# Patient Record
Sex: Female | Born: 1985 | Race: White | Hispanic: No | Marital: Married | Smoking: Never smoker
Health system: Southern US, Community
[De-identification: ages and names within clinical notes are randomized; demographics above are authoritative.]

## PROBLEM LIST (undated history)

## (undated) ENCOUNTER — Inpatient Hospital Stay (HOSPITAL_COMMUNITY): Payer: Self-pay

## (undated) DIAGNOSIS — T7840XA Allergy, unspecified, initial encounter: Secondary | ICD-10-CM

## (undated) DIAGNOSIS — G43909 Migraine, unspecified, not intractable, without status migrainosus: Secondary | ICD-10-CM

## (undated) DIAGNOSIS — F419 Anxiety disorder, unspecified: Secondary | ICD-10-CM

## (undated) HISTORY — DX: Migraine, unspecified, not intractable, without status migrainosus: G43.909

## (undated) HISTORY — PX: WISDOM TOOTH EXTRACTION: SHX21

## (undated) HISTORY — DX: Allergy, unspecified, initial encounter: T78.40XA

## (undated) HISTORY — DX: Anxiety disorder, unspecified: F41.9

---
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
## 2008-04-03 ENCOUNTER — Ambulatory Visit: Payer: Self-pay | Admitting: Internal Medicine

## 2008-04-03 DIAGNOSIS — K5289 Other specified noninfective gastroenteritis and colitis: Secondary | ICD-10-CM | POA: Insufficient documentation

## 2008-04-03 DIAGNOSIS — G43909 Migraine, unspecified, not intractable, without status migrainosus: Secondary | ICD-10-CM | POA: Insufficient documentation

## 2008-11-04 ENCOUNTER — Ambulatory Visit: Payer: Self-pay | Admitting: Family Medicine

## 2008-11-04 DIAGNOSIS — J029 Acute pharyngitis, unspecified: Secondary | ICD-10-CM | POA: Insufficient documentation

## 2008-11-04 DIAGNOSIS — B2790 Infectious mononucleosis, unspecified without complication: Secondary | ICD-10-CM | POA: Insufficient documentation

## 2008-11-05 ENCOUNTER — Encounter: Payer: Self-pay | Admitting: Family Medicine

## 2008-11-10 ENCOUNTER — Encounter: Payer: Self-pay | Admitting: Family Medicine

## 2008-11-16 ENCOUNTER — Telehealth (INDEPENDENT_AMBULATORY_CARE_PROVIDER_SITE_OTHER): Payer: Self-pay | Admitting: *Deleted

## 2008-11-16 LAB — CONVERTED CEMR LAB
AST: 67 units/L — ABNORMAL HIGH (ref 0–37)
Albumin: 3.7 g/dL (ref 3.5–5.2)
Alkaline Phosphatase: 57 units/L (ref 39–117)
BUN: 13 mg/dL (ref 6–23)
Bilirubin, Direct: 0 mg/dL (ref 0.0–0.3)
Eosinophils Relative: 0.5 % (ref 0.0–5.0)
GFR calc non Af Amer: 133.01 mL/min (ref >60)
HCT: 45 % (ref 36.0–46.0)
Hemoglobin: 15.3 g/dL — ABNORMAL HIGH (ref 12.0–15.0)
Lymphocytes Relative: 53.2 % — ABNORMAL HIGH (ref 12.0–46.0)
Lymphs Abs: 4.4 10*3/uL — ABNORMAL HIGH (ref 0.7–4.0)
Monocytes Relative: 11.7 % (ref 3.0–12.0)
Platelets: 187 10*3/uL (ref 150.0–400.0)
Potassium: 4.8 meq/L (ref 3.5–5.1)
Sodium: 144 meq/L (ref 135–145)
Total Bilirubin: 0.5 mg/dL (ref 0.3–1.2)
WBC: 8.1 10*3/uL (ref 4.5–10.5)

## 2008-11-20 ENCOUNTER — Ambulatory Visit: Payer: Self-pay | Admitting: Family Medicine

## 2008-12-02 ENCOUNTER — Telehealth (INDEPENDENT_AMBULATORY_CARE_PROVIDER_SITE_OTHER): Payer: Self-pay | Admitting: *Deleted

## 2008-12-02 LAB — CONVERTED CEMR LAB
ALT: 17 units/L (ref 0–35)
AST: 18 units/L (ref 0–37)
Albumin: 3.6 g/dL (ref 3.5–5.2)
Total Protein: 6.9 g/dL (ref 6.0–8.3)
hCG, Beta Chain, Quant, S: <0.5 milliintl units/mL

## 2009-02-19 ENCOUNTER — Telehealth (INDEPENDENT_AMBULATORY_CARE_PROVIDER_SITE_OTHER): Payer: Self-pay | Admitting: *Deleted

## 2009-02-19 ENCOUNTER — Ambulatory Visit: Payer: Self-pay | Admitting: Family

## 2009-02-19 DIAGNOSIS — R209 Unspecified disturbances of skin sensation: Secondary | ICD-10-CM | POA: Insufficient documentation

## 2009-02-23 ENCOUNTER — Encounter: Payer: Self-pay | Admitting: Family Medicine

## 2009-02-23 ENCOUNTER — Ambulatory Visit: Payer: Self-pay | Admitting: Family

## 2009-02-26 ENCOUNTER — Telehealth (INDEPENDENT_AMBULATORY_CARE_PROVIDER_SITE_OTHER): Payer: Self-pay | Admitting: *Deleted

## 2009-04-08 ENCOUNTER — Ambulatory Visit: Payer: Self-pay | Admitting: Family Medicine

## 2009-04-08 DIAGNOSIS — J019 Acute sinusitis, unspecified: Secondary | ICD-10-CM | POA: Insufficient documentation

## 2009-04-09 ENCOUNTER — Telehealth (INDEPENDENT_AMBULATORY_CARE_PROVIDER_SITE_OTHER): Payer: Self-pay | Admitting: *Deleted

## 2009-04-16 ENCOUNTER — Ambulatory Visit: Payer: Self-pay | Admitting: Family Medicine

## 2009-04-16 ENCOUNTER — Telehealth (INDEPENDENT_AMBULATORY_CARE_PROVIDER_SITE_OTHER): Payer: Self-pay | Admitting: *Deleted

## 2009-04-16 DIAGNOSIS — D518 Other vitamin B12 deficiency anemias: Secondary | ICD-10-CM | POA: Insufficient documentation

## 2009-04-16 DIAGNOSIS — L259 Unspecified contact dermatitis, unspecified cause: Secondary | ICD-10-CM | POA: Insufficient documentation

## 2010-02-08 NOTE — Letter (Signed)
Summary: Out of Work  [facility] [employer]  [address] [place], [place] [address]   Phone: [phone]  Fax: [phone]    April 08, 2009   Employee:  Cynda Acres    To Whom It May Concern:   For Medical reasons, please excuse the above named employee from work for the following dates:  Start:   April 08, 2009  End:   April 09, 2009  If you need additional information, please feel free to contact our office.         Sincerely,    Loreen Freud DO
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
## 2010-02-08 NOTE — Progress Notes (Signed)
Summary: reaction to doxycycline/ldr lowne see  Phone Note Call from Patient Call back at [phone]   Caller: Mom Summary of Call: pt was seen a week ago was given a rx Doxycycline, pt having a reaction; wanted a call back Called back got VM, left msg for pt to call .Kandice Hams  April 16, 2009 11:33 AM spoker with pt mom, pt having itching, red and severly swollen on hands and feet pt d/c med last night  started med on 4/1/11given Benadryl which helped a little now has pain in hand. mom says hands and fngers look like little sausages.  Pt uses [facility] [address] in [place]  Initial call taken by: Kandice Hams,  April 16, 2009 12:31 PM  Follow-up for Phone Call        any trouble breathing cp etc----stop doxy con't benadryl if any sob or cp go to ER--otherwise ov here  Spoke with pt mom ov scheduled .Kandice Hams  April 16, 2009 2:21 PM  Follow-up by: Loreen Freud DO,  April 16, 2009 1:10 PM

## 2010-02-08 NOTE — Assessment & Plan Note (Signed)
Summary: FLU ?/KDC   Vital Signs:  Patient profile:   25 year old female Weight:      102 pounds Temp:     98.4 degrees F oral Pulse rate:   64 / minute Pulse rhythm:   regular BP sitting:   110 / 70  (left arm) Cuff size:   regular  Vitals Entered By: Army Fossa CMA (April 08, 2009 3:12 PM) CC: Pt here for HA, pressure in ears, head congestion, chest congestion. Started Saturday.Has used theraflu. No fever., URI symptoms   History of Present Illness:       This is a 25 year old woman who presents with URI symptoms.  The symptoms began 6 days ago.  Pt here c/o URI since Saturday.  Pt using Theraflu and otc cough med.  The patient complains of nasal congestion, purulent nasal discharge, dry cough, and sick contacts, but denies earache.  The patient denies fever, low-grade fever (<100.5 degrees), fever of 100.5-103 degrees, fever of 103.1-104 degrees, fever to >104 degrees, stiff neck, dyspnea, wheezing, rash, vomiting, diarrhea, use of an antipyretic, and response to antipyretic.  The patient also reports headache.  The patient denies itchy watery eyes, itchy throat, sneezing, seasonal symptoms, response to antihistamine, muscle aches, and severe fatigue.  The patient denies the following risk factors for Strep sinusitis: unilateral facial pain, unilateral nasal discharge, poor response to decongestant, double sickening, tooth pain, Strep exposure, tender adenopathy, and absence of cough.    Current Medications (verified): 1)  Topamax 200 Mg Tabs (Topiramate) .Marland Kitchen.. 1 By Mouth Daily. 2)  Bcp 3)  Biaxin Xl 500 Mg Xr24h-Tab (Clarithromycin) .... 2 By Mouth Once Daily 4)  Prenatal Vitamins 0.8 Mg Tabs (Prenatal Multivit-Min-Fe-Fa) 5)  Cheratussin Ac 100-10 Mg/31ml Syrp (Guaifenesin-Codeine) .Marland Kitchen.. 1-2 Tsp By Mouth At Bedtime As Needed Cough 6)  Veramyst 27.5 Mcg/spray Susp (Fluticasone Furoate) .... 2 Sprays Each Nostril Once Daily  Allergies: 1)  ! Penicillin V Potassium (Penicillin V  Potassium)  Physical Exam  General:  Well-developed,well-nourished,in no acute distress; alert,appropriate and cooperative throughout examination Ears:  External ear exam shows no significant lesions or deformities.  Otoscopic examination reveals clear canals, tympanic membranes are intact bilaterally without bulging, retraction, inflammation or discharge. Hearing is grossly normal bilaterally. Nose:  L frontal sinus tenderness, L maxillary sinus tenderness, R frontal sinus tenderness, and R maxillary sinus tenderness.   Mouth:  Oral mucosa and oropharynx without lesions or exudates.  Teeth in good repair. Neck:  No deformities, masses, or tenderness noted. Lungs:  Normal respiratory effort, chest expands symmetrically. Lungs are clear to auscultation, no crackles or wheezes. Heart:  normal rate and no murmur.   Extremities:  No clubbing, cyanosis, edema, or deformity noted with normal full range of motion of all joints.   Psych:  Cognition and judgment appear intact. Alert and cooperative with normal attention span and concentration. No apparent delusions, illusions, hallucinations   Impression & Recommendations:  Problem # 1:  SINUSITIS - ACUTE-NOS (ICD-461.9)  Her updated medication list for this problem includes:    Biaxin Xl 500 Mg Xr24h-tab (Clarithromycin) .Marland Kitchen... 2 by mouth once daily    Cheratussin Ac 100-10 Mg/55ml Syrp (Guaifenesin-codeine) .Marland Kitchen... 1-2 tsp by mouth at bedtime as needed cough    Veramyst 27.5 Mcg/spray Susp (Fluticasone furoate) .Marland Kitchen... 2 sprays each nostril once daily  Instructed on treatment. Call if symptoms persist or worsen.   Complete Medication List: 1)  Topamax 200 Mg Tabs (Topiramate) .Marland Kitchen.. 1 by mouth daily. 2)  Bcp  3)  Biaxin Xl 500 Mg Xr24h-tab (Clarithromycin) .... 2 by mouth once daily 4)  Prenatal Vitamins 0.8 Mg Tabs (Prenatal multivit-min-fe-fa) 5)  Cheratussin Ac 100-10 Mg/100ml Syrp (Guaifenesin-codeine) .Marland Kitchen.. 1-2 tsp by mouth at bedtime as needed  cough 6)  Veramyst 27.5 Mcg/spray Susp (Fluticasone furoate) .... 2 sprays each nostril once daily Prescriptions: CHERATUSSIN AC 100-10 MG/5ML SYRP (GUAIFENESIN-CODEINE) 1-2 tsp by mouth at bedtime as needed cough  #6 oz x 0   Entered and Authorized by:   Loreen Freud DO   Signed by:   Loreen Freud DO on 04/08/2009   Method used:   Print then Give to Patient   RxID:   [id number] BIAXIN XL 500 MG XR24H-TAB (CLARITHROMYCIN) 2 by mouth once daily  #28 x 0   Entered and Authorized by:   Loreen Freud DO   Signed by:   Loreen Freud DO on 04/08/2009   Method used:   Electronically to        [facility] (retail)       [address]       [place], [place]  [address]       Ph: [phone]       Fax: [phone]   RxID:   [id number]

## 2010-02-08 NOTE — Progress Notes (Signed)
Summary: Cheaper meds   Phone Note Call from Patient Call back at Home Phone [phone]   Caller: Dad Summary of Call: Pts dad called stated that the Biaxin was to expensive for her to get. Is there a cheaper medication she can get? Army Fossa CMA  April 09, 2009 10:15 AM   Follow-up for Phone Call        doxycycline 100 1 by mouth two times a day for 10 days Follow-up by: Loreen Freud DO,  April 09, 2009 10:51 AM  Additional Follow-up for Phone Call Additional follow up Details #1::        Spoke with Morrie Sheldon and she is aware of med change and would like rx sent to [facility] in [place] Additional Follow-up by: Shonna Chock,  April 09, 2009 11:22 AM    New/Updated Medications: DOXYCYCLINE HYCLATE 100 MG CAPS (DOXYCYCLINE HYCLATE) 1 by mouth two times a day Prescriptions: DOXYCYCLINE HYCLATE 100 MG CAPS (DOXYCYCLINE HYCLATE) 1 by mouth two times a day  #20 x 0   Entered by:   Shonna Chock   Authorized by:   Loreen Freud DO   Signed by:   Shonna Chock on 04/09/2009   Method used:   Electronically to        [facility] (retail)       [address]       [place], [place]  [address]       Ph: [phone]       Fax: [phone]   RxID:   [id number]

## 2010-02-08 NOTE — Assessment & Plan Note (Signed)
Summary: reaction to abx/alr   Vital Signs:  Patient profile:   25 year old female Weight:      101.13 pounds Pulse rate:   72 / minute Pulse rhythm:   regular BP sitting:   122 / 78  (left arm) Cuff size:   regular  Vitals Entered By: Army Fossa CMA (April 16, 2009 3:56 PM) CC: Pt has redness, swelling in her hands and feet. Numbness in arms since monday.   History of Present Illness: Pt here c/o reddness and swelling in fingers  a few days after starting doxy.   It is painful and itchy.   Current Medications (verified): 1)  Topamax 200 Mg Tabs (Topiramate) .Marland Kitchen.. 1 By Mouth Daily. 2)  Bcp 3)  Doxycycline Hyclate 100 Mg Caps (Doxycycline Hyclate) .Marland Kitchen.. 1 By Mouth Two Times A Day 4)  Prenatal Vitamins 0.8 Mg Tabs (Prenatal Multivit-Min-Fe-Fa) 5)  Cheratussin Ac 100-10 Mg/62ml Syrp (Guaifenesin-Codeine) .Marland Kitchen.. 1-2 Tsp By Mouth At Bedtime As Needed Cough 6)  Veramyst 27.5 Mcg/spray Susp (Fluticasone Furoate) .... 2 Sprays Each Nostril Once Daily 7)  Prednisone 10 Mg Tabs (Prednisone) .... 3 By Mouth Once Daily For 3 Days Then 2 By Mouth Once Daily For 3 Days Then 1 By Mouth Once Daily For 3 Days  Allergies: 1)  ! Penicillin V Potassium (Penicillin V Potassium)  Past History:  Past medical, surgical, family and social histories (including risk factors) reviewed for relevance to current acute and chronic problems.  Past Medical History: Reviewed history from 04/03/2008 and no changes required. Headache, migraines  Past Surgical History: Reviewed history from 04/03/2008 and no changes required. G 0 P0  Denies surgical history  Family History: Reviewed history and no changes required.  Social History: Reviewed history and no changes required.  Review of Systems      See HPI  Physical Exam  General:  Well-developed,well-nourished,in no acute distress; alert,appropriate and cooperative throughout examination Skin:  + errythema both hands index fingers and thumbs toes  swollen but no errythema Psych:  Oriented X3 and normally interactive.     Impression & Recommendations:  Problem # 1:  CONTACT DERMATITIS&OTHER ECZEMA DUE UNSPEC CAUSE (ICD-692.9)  not convinced it is doxy  con't zyrtec Her updated medication list for this problem includes:    Prednisone 10 Mg Tabs (Prednisone) .Marland KitchenMarland KitchenMarland KitchenMarland Kitchen 3 by mouth once daily for 3 days then 2 by mouth once daily for 3 days then 1 by mouth once daily for 3 days  Orders: Admin of Therapeutic Inj  intramuscular or subcutaneous (72536) Depo- Medrol 80mg  (J1040)  Complete Medication List: 1)  Topamax 200 Mg Tabs (Topiramate) .Marland Kitchen.. 1 by mouth daily. 2)  Bcp  3)  Doxycycline Hyclate 100 Mg Caps (Doxycycline hyclate) .Marland Kitchen.. 1 by mouth two times a day 4)  Prenatal Vitamins 0.8 Mg Tabs (Prenatal multivit-min-fe-fa) 5)  Cheratussin Ac 100-10 Mg/20ml Syrp (Guaifenesin-codeine) .Marland Kitchen.. 1-2 tsp by mouth at bedtime as needed cough 6)  Veramyst 27.5 Mcg/spray Susp (Fluticasone furoate) .... 2 sprays each nostril once daily 7)  Prednisone 10 Mg Tabs (Prednisone) .... 3 by mouth once daily for 3 days then 2 by mouth once daily for 3 days then 1 by mouth once daily for 3 days  Other Orders: Vit B12 1000 mcg (J3420) Prescriptions: PREDNISONE 10 MG TABS (PREDNISONE) 3 by mouth once daily for 3 days then 2 by mouth once daily for 3 days then 1 by mouth once daily for 3 days  #18 x 0  Entered and Authorized by:   Loreen Freud DO   Signed by:   Loreen Freud DO on 04/16/2009   Method used:   Electronically to        [facility] (retail)       [address]       [place], [place]  [address]       Ph: [phone]       Fax: [phone]   RxID:   [id number]    Medication Administration  Injection # 1:    Medication: Depo- Medrol 80mg     Diagnosis: CONTACT DERMATITIS&OTHER ECZEMA DUE UNSPEC CAUSE (ICD-692.9)    Route: IM    Site: RUOQ gluteus    Exp Date: 11/2009    Lot #: [id number]    Mfr:  novaplus    Patient tolerated injection without complications    Given by: Army Fossa CMA (April 16, 2009 4:22 PM)  Injection # 2:    Medication: Vit B12 1000 mcg    Diagnosis: OTHER VITAMIN B12 DEFICIENCY ANEMIA (ICD-281.1)    Route: IM    Site: R deltoid    Exp Date: 02/2011    Lot #: [id number]    Mfr: [employer] Regent    Patient tolerated injection without complications    Given by: Army Fossa CMA (April 16, 2009 4:23 PM)  Orders Added: 1)  Admin of Therapeutic Inj  intramuscular or subcutaneous [96372] 2)  Vit B12 1000 mcg [J3420] 3)  Depo- Medrol 80mg  [J1040] 4)  Est. Patient Level III [43329]

## 2010-02-08 NOTE — Progress Notes (Signed)
Summary: f/u labs  Phone Note Outgoing Call   Summary of Call: Please arrange follow up for following tests RPR, B12, Folate (782) Thanks Initial call taken by: Lemont Fillers FNP,  February 19, 2009 5:56 PM  Follow-up for Phone Call        spoke w/ patient father appt scheduled for labs........Marland KitchenDoristine Devoid  February 22, 2009 2:06 PM

## 2010-02-08 NOTE — Progress Notes (Signed)
Summary: Lab Results   Phone Note Outgoing Call   Call placed by: Army Fossa CMA,  February 26, 2009 2:46 PM Summary of Call: Regarding lab results, LMTCB:  B12 injections weekly for 1 month then monthly---recheck 1 month----b12 is low Signed by Loreen Freud DO on 02/26/2009 at 12:43 PM  Follow-up for Phone Call        Pt is aware. Army Fossa CMA  March 01, 2009 9:10 AM

## 2012-05-13 ENCOUNTER — Ambulatory Visit (INDEPENDENT_AMBULATORY_CARE_PROVIDER_SITE_OTHER): Payer: No Typology Code available for payment source | Admitting: Family Medicine

## 2012-05-13 ENCOUNTER — Encounter: Payer: Self-pay | Admitting: Family Medicine

## 2012-05-13 VITALS — BP 100/70 | HR 65 | Temp 98.2°F | Ht 60.25 in | Wt 104.4 lb

## 2012-05-13 DIAGNOSIS — Z91030 Bee allergy status: Secondary | ICD-10-CM

## 2012-05-13 DIAGNOSIS — R51 Headache: Secondary | ICD-10-CM

## 2012-05-13 DIAGNOSIS — Z91038 Other insect allergy status: Secondary | ICD-10-CM

## 2012-05-13 MED ORDER — EPINEPHRINE 0.3 MG/0.3ML IJ SOAJ: 0.3000 mg | Freq: Once | INTRAMUSCULAR | Status: DC | PRN

## 2012-05-13 NOTE — Patient Instructions (Addendum)
Schedule your complete physical at your convenience Use the Epipen only as needed Try and wean off the topamax Call with any questions or concerns Welcome Back!!!

## 2012-05-13 NOTE — Progress Notes (Signed)
  Subjective:    Patient ID: Lisa Berg, female    DOB: 1985/11/09, 27 y.o.   MRN: [id number]  HPI Allergic rxn- pt has hx of allergy to bee stings.  Has not had to use epipen and current pen was filled in Aug 2011.  Does not struggle w/ seasonal allergies.  Has never had a food allergy.  Reports at time of last sting, her throat swelled and she had difficulty breathing.  Migraines- currently on Topamax, seeing Neuro in [place].  topamax does fairly well at controlling migraines.  Will have ~5 migraines/month.  HAs improve w/ Advil liquid gels.  Pt is questioning whether she needs to wean off topamax b/c she recently stopped her birth control and is not doing anything to prevent pregnancy.   Review of Systems For ROS see HPI     Objective:   Physical Exam  Vitals reviewed. Constitutional: She is oriented to person, place, and time. She appears well-developed and well-nourished. No distress.  HENT:  Head: Normocephalic and atraumatic.  Eyes: Conjunctivae and EOM are normal. Pupils are equal, round, and reactive to light.  Neck: Normal range of motion. Neck supple. No thyromegaly present.  Cardiovascular: Normal rate, regular rhythm, normal heart sounds and intact distal pulses.   No murmur heard. Pulmonary/Chest: Effort normal and breath sounds normal. No respiratory distress.  Abdominal: Soft. She exhibits no distension. There is no tenderness.  Musculoskeletal: She exhibits no edema.  Lymphadenopathy:    She has no cervical adenopathy.  Neurological: She is alert and oriented to person, place, and time.  Skin: Skin is warm and dry.  Psychiatric: She has a normal mood and affect. Her behavior is normal.          Assessment & Plan:

## 2012-05-16 DIAGNOSIS — Z91030 Bee allergy status: Secondary | ICD-10-CM | POA: Insufficient documentation

## 2012-05-16 NOTE — Assessment & Plan Note (Signed)
New to provider, ongoing for pt.  Following w/ neuro in [place].  Discussed w/ pt and husband that Topamax is category D in pregnancy and can cause cleft lip/palate.  Strongly encouraged that pt use condoms until able to wean off Topamax w/ assistance of neuro.  Pt expressed understanding and is in agreement w/ plan.

## 2012-05-16 NOTE — Assessment & Plan Note (Signed)
New.  Based on report, it sounds as if pt has true anaphylaxis.  New script for Epi-pen given along w/ instructions on use.

## 2012-06-14 ENCOUNTER — Encounter: Payer: Self-pay | Admitting: Family Medicine

## 2012-06-14 ENCOUNTER — Ambulatory Visit (INDEPENDENT_AMBULATORY_CARE_PROVIDER_SITE_OTHER): Payer: No Typology Code available for payment source | Admitting: Family Medicine

## 2012-06-14 VITALS — BP 100/58 | HR 88 | Temp 98.9°F | Ht 60.25 in | Wt 111.6 lb

## 2012-06-14 DIAGNOSIS — O98519 Other viral diseases complicating pregnancy, unspecified trimester: Secondary | ICD-10-CM

## 2012-06-14 DIAGNOSIS — O98511 Other viral diseases complicating pregnancy, first trimester: Secondary | ICD-10-CM

## 2012-06-14 DIAGNOSIS — B349 Viral infection, unspecified: Secondary | ICD-10-CM | POA: Insufficient documentation

## 2012-06-14 NOTE — Patient Instructions (Addendum)
We'll notify you of your lab results Wash hands OFTEN to try avoid illness Drink plenty of fluids REST! CONGRATS!!!!

## 2012-06-14 NOTE — Progress Notes (Signed)
  Subjective:    Patient ID: Lisa Berg, female    DOB: August 04, 1985, 27 y.o.   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  Multiple children sent home w/ HFM.  Pt currently feeling well- no upper respiratory sxs or rash.  No fevers.  Has appt upcoming w/ GYN.   Review of Systems For ROS see HPI     Objective:   Physical Exam  Vitals reviewed. Constitutional: She is oriented to person, place, and time. She appears well-developed and well-nourished. No distress.  HENT:  Head: Normocephalic and atraumatic.  Nose: Nose normal.  Mouth/Throat: Oropharynx is clear and moist. No oropharyngeal exudate.  No oral ulcers, no erythema of throat  Neurological: She is alert and oriented to person, place, and time.  Skin: Skin is warm and dry. No rash noted.  Psychiatric: She has a normal mood and affect. Her behavior is normal. Thought content normal.          Assessment & Plan:

## 2012-06-16 ENCOUNTER — Inpatient Hospital Stay (HOSPITAL_COMMUNITY)
Admission: AD | Admit: 2012-06-16 | Discharge: 2012-06-17 | Disposition: A | Discharge status: Home / Self Care | Payer: No Typology Code available for payment source | Source: Ambulatory Visit | Attending: Obstetrics & Gynecology | Admitting: Obstetrics & Gynecology

## 2012-06-16 DIAGNOSIS — O209 Hemorrhage in early pregnancy, unspecified: Secondary | ICD-10-CM | POA: Insufficient documentation

## 2012-06-16 NOTE — Assessment & Plan Note (Signed)
New.  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  Pt expressed understanding and is in agreement w/ plan.

## 2012-06-16 NOTE — MAU Note (Signed)
Pt reports she is [redacted] weeks pregnant and when she went to the restroom tonight she saw some blood on the tissue. Denies pain

## 2012-06-17 ENCOUNTER — Inpatient Hospital Stay (HOSPITAL_COMMUNITY): Payer: No Typology Code available for payment source

## 2012-06-17 ENCOUNTER — Encounter (HOSPITAL_COMMUNITY): Payer: Self-pay | Admitting: *Deleted

## 2012-06-17 DIAGNOSIS — O209 Hemorrhage in early pregnancy, unspecified: Secondary | ICD-10-CM

## 2012-06-17 LAB — CBC
Platelets: 270 10*3/uL (ref 150–400)
RBC: 4.2 MIL/uL (ref 3.87–5.11)
WBC: 10.3 10*3/uL (ref 4.0–10.5)

## 2012-06-17 LAB — ABO/RH: ABO/RH(D): A POS

## 2012-06-17 NOTE — MAU Provider Note (Signed)
History     CSN: [id number]  Arrival date and time: 06/16/12 2338   None     Chief Complaint  Patient presents with  . Vaginal Bleeding  . Possible Pregnancy   HPI  SOFFIA DOSHIER is a 27 y.o. G1P0 at [redacted]w[redacted]d who presents today with vaginal bleeding. She states that yesterday she had some spotting and today she had heavier bleeding that started around 2130-2200. She denies any pain. She is planning on going to [facility] and has an appointment there on Thursday.   Past Medical History  Diagnosis Date  . Migraine     History reviewed. No pertinent past surgical history.  Family History  Problem Relation Age of Onset  . Cancer Mother   . Depression Mother   . Hyperlipidemia Mother   . Hyperlipidemia Father   . Heart disease Father   . Hypertension Father   . Hyperlipidemia Sister   . Stroke Maternal Aunt   . Miscarriages / Stillbirths Maternal Grandmother   . Diabetes Maternal Grandfather   . Heart disease Maternal Grandfather   . Asthma Neg Hx   . Birth defects Neg Hx   . COPD Neg Hx   . Drug abuse Neg Hx   . Hearing loss Neg Hx   . Kidney disease Neg Hx   . Learning disabilities Neg Hx     History  Substance Use Topics  . Smoking status: Never Smoker   . Smokeless tobacco: Not on file  . Alcohol Use: No    Allergies:  Allergies  Allergen Reactions  . Penicillins     REACTION: rash    Prescriptions prior to admission  Medication Sig Dispense Refill  . Cyanocobalamin (VITAMIN B 12 PO) Take 1 tablet by mouth daily.      Marland Kitchen PRENATAL VITAMINS PO Take 1 tablet by mouth daily.      Marland Kitchen topiramate (TOPAMAX) 200 MG tablet Take 200 mg by mouth daily.      Marland Kitchen EPINEPHrine (EPI-PEN) 0.3 mg/0.3 mL DEVI Inject 0.3 mLs (0.3 mg total) into the muscle once as needed (Use as directed for allergy reaction.).  1 Device  3    Review of Systems  Constitutional: Negative for fever and chills.  Respiratory: Negative for shortness of breath.   Cardiovascular:  Negative for chest pain.  Gastrointestinal: Negative for nausea, vomiting, abdominal pain, diarrhea and constipation.  Genitourinary: Negative for dysuria, urgency and frequency.  Musculoskeletal: Negative for myalgias.  Neurological: Negative for dizziness.   Physical Exam   Blood pressure 122/66, pulse 96, temperature 98.7 F (37.1 C), temperature source Oral, resp. rate 20, height 5' (1.524 m), weight 50.803 kg (112 lb), last menstrual period 04/29/2012, SpO2 100.00%.  Physical Exam  Nursing note and vitals reviewed. Constitutional: She is oriented to person, place, and time. She appears well-developed and well-nourished. No distress.  Cardiovascular: Normal rate.   Respiratory: Effort normal.  GI: Soft.  Neurological: She is alert and oriented to person, place, and time.  Skin: Skin is warm and dry.  Psychiatric: She has a normal mood and affect.    MAU Course  Procedures  Results for orders placed during the hospital encounter of 06/16/12 (from the past 24 hour(s))  CBC     Status: None   Collection Time    06/17/12 12:20 AM      Result Value Range   WBC 10.3  4.0 - 10.5 K/uL   RBC 4.20  3.87 - 5.11 MIL/uL   Hemoglobin  12.5  12.0 - 15.0 g/dL   HCT 16.1  09.6 - 04.5 %   MCV 89.3  78.0 - 100.0 fL   MCH 29.8  26.0 - 34.0 pg   MCHC 33.3  30.0 - 36.0 g/dL   RDW 40.9  81.1 - 91.4 %   Platelets 270  150 - 400 K/uL  ABO/RH     Status: None   Collection Time    06/17/12 12:20 AM      Result Value Range   ABO/RH(D) A POS     *RADIOLOGY REPORT*  Clinical Data: Bleeding with early pregnancy. Estimated gestational  age by LMP is 7 weeks 1 day. Quantitative beta HCG is pending.  Latex allergy.  OBSTETRIC <14 WK ULTRASOUND  Technique: Transabdominal ultrasound was performed for evaluation  of the gestation as well as the maternal uterus and adnexal  regions.  Comparison: None.  Intrauterine gestational sac: Visualized/normal in shape.  Yolk sac: Yolk sac is identified.   Embryo: Fetal pole is identified.  Cardiac Activity: Fetal cardiac activity is identified.  Heart Rate: 114 bpm  CRL: 9.4 mm 7 w 0 d [place] EDC: 02/03/2013  Maternal uterus/Adnexae:  No subchorionic hemorrhage identified. No myometrial masses. No  free pelvic fluid collections. Both ovaries are visualized and  appear symmetrical. No abnormal adnexal masses identified.  IMPRESSION:  Single intrauterine pregnancy. Estimated gestational age by crown-  rump length is 7-week 0 days. This is consistent with reported  maternal dates.  Original Report Authenticated By: Burman Nieves, M.D.  Assessment and Plan   1. First trimester bleeding    Danger signs reviewed [facility] as scheduled Return to MAU as needed  Tawnya Crook 06/17/2012, 1:10 AM

## 2012-06-17 NOTE — MAU Note (Signed)
Pt reports that she was noticed light bleeding at 2200. Pt had some bleeding "like you are just about to start your period" yesterday then had brown discharge this am. Pt denies pain.

## 2012-06-17 NOTE — MAU Provider Note (Signed)
Attestation of Attending Supervision of Advanced Practitioner (CNM/NP): Evaluation and management procedures were performed by the Advanced Practitioner under my supervision and collaboration.  I have reviewed the Advanced Practitioner's note and chart, and I agree with the management and plan.  HARRAWAY-SMITH, Dasha Kawabata 1:36 AM

## 2012-06-19 LAB — PARVOVIRUS B19 ANTIBODY, IGG AND IGM
Parovirus B19 IgG Abs: 0.2 index (ref <0.9)
Parovirus B19 IgM Abs: 0.1 index (ref <0.9)

## 2012-06-26 LAB — OB RESULTS CONSOLE HEPATITIS B SURFACE ANTIGEN: Hepatitis B Surface Ag: NEGATIVE

## 2012-06-26 LAB — OB RESULTS CONSOLE RPR: RPR: NONREACTIVE

## 2012-06-26 LAB — OB RESULTS CONSOLE ABO/RH: RH Type: POSITIVE

## 2012-07-22 ENCOUNTER — Encounter: Payer: No Typology Code available for payment source | Attending: Obstetrics and Gynecology | Admitting: Dietician

## 2012-07-22 ENCOUNTER — Encounter: Payer: Self-pay | Admitting: Dietician

## 2012-07-22 VITALS — Ht 60.0 in | Wt 112.1 lb

## 2012-07-22 DIAGNOSIS — E638 Other specified nutritional deficiencies: Secondary | ICD-10-CM | POA: Insufficient documentation

## 2012-07-22 DIAGNOSIS — Z713 Dietary counseling and surveillance: Secondary | ICD-10-CM | POA: Insufficient documentation

## 2012-07-22 DIAGNOSIS — O99891 Other specified diseases and conditions complicating pregnancy: Secondary | ICD-10-CM | POA: Insufficient documentation

## 2012-07-22 DIAGNOSIS — R638 Other symptoms and signs concerning food and fluid intake: Secondary | ICD-10-CM

## 2012-07-22 NOTE — Patient Instructions (Addendum)
Include protein sources with meals and snacks such as eggs (scrambled, or omelet), cheese sticks, nuts, peanut butter, sunflower seeds and milk.  Add more vegetables such as carrots, salads, green beans.  Take B12 and prenatal each day.  Talk with doctor to see if one can of caffenated soda per day is okay. Try tapering down or switching to decaf if possible.   Add walks for physical activity when possible.

## 2012-07-22 NOTE — Progress Notes (Signed)
  Medical Nutrition Therapy:  Appt start time: 1500 end time:  1600.   Assessment:  Primary concerns today: Patient is concerned that she doesn't eat meat and is pregnant. Patient says she is picky and does not like most beans, but does drink plenty (3-4 cups) of whole milk throughout the day. 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 In addition to milk, she sometimes eats eggs and cheese for protein. She likes most fruit and vegetables such as baked beans, green beans, peas, broccoli, carrots, corn, yams, and salads. Gracynn also usually has one caffeinated soda each day which she hasn't discuss with her doctor.   Catheryne is taking her prenatal vitamin and B12 each day and was advised to make sure to include protein such as milk, eggs, peanut butter, nuts, or seeds with meals and snacks. Discussed adding more vegetables to meals if possible and talking to her doctor to see if it's ok to continue drinking soda with caffeine.      MEDICATIONS: see list   DIETARY INTAKE:  Avoided foods include meat, seafood.   24-hr recall:  B ( AM): cereal (cheerios, fruit loops) with whole milk, muffins, pop tarts, waffles, sometimes an omelet. Sometimes drinks water, or soda, juice rarely. Snk ( AM): none L ( PM): pizza, pasta, leftovers. Drinks tea, lemonade, soda (if not with breakfast), water. Snk ( PM): Special K bar, chips, crackers, carrots, fruit D ( PM): macaroni and cheese, pizza, vegetable or two, sometimes drinks milk with dinner Beverages: 3-4 cups total per day  Snk ( PM): none usually, sometimes ice cream  Usual physical activity: walk once in a while  Estimated energy needs: 2200 calories 248 g carbohydrates 165 g protein 61 g fat  Progress Towards Goal(s):  In progress.   Nutritional Diagnosis:  NB-1.1 Food and nutrition-related knowledge deficit As related to vegetarian diet .  As evidenced by inadequate protein  intake during pregnancy. .    Intervention:  Nutrition Counseling provided:  Include protein sources with meals and snacks such as eggs (scrambled or omelet), cheese sticks, nuts, peanut butter, sunflower seeds and milk.  Add more vegetables such as carrots, salads, green beans.  Take B12 and prenatal each day.  Talk with doctor to see if one can of caffenated soda per day is okay. Try tapering down or switching to decaf if possible.   Add walks for physical activity when possible.    Monitoring/Evaluation:  Dietary intake, exercise, and body weight in 2 month(s).

## 2012-08-01 ENCOUNTER — Ambulatory Visit: Payer: Self-pay | Admitting: *Deleted

## 2012-09-23 ENCOUNTER — Ambulatory Visit: Payer: No Typology Code available for payment source | Admitting: Dietician

## 2012-10-24 ENCOUNTER — Inpatient Hospital Stay (HOSPITAL_COMMUNITY)
Admission: AD | Admit: 2012-10-24 | Discharge: 2012-10-24 | Disposition: A | Discharge status: Home / Self Care | Payer: No Typology Code available for payment source | Source: Ambulatory Visit | Attending: Obstetrics and Gynecology | Admitting: Obstetrics and Gynecology

## 2012-10-24 ENCOUNTER — Encounter (HOSPITAL_COMMUNITY): Payer: Self-pay | Admitting: *Deleted

## 2012-10-24 DIAGNOSIS — W19XXXA Unspecified fall, initial encounter: Secondary | ICD-10-CM

## 2012-10-24 DIAGNOSIS — S3991XA Unspecified injury of abdomen, initial encounter: Secondary | ICD-10-CM

## 2012-10-24 DIAGNOSIS — O99891 Other specified diseases and conditions complicating pregnancy: Secondary | ICD-10-CM | POA: Insufficient documentation

## 2012-10-24 DIAGNOSIS — R1084 Generalized abdominal pain: Secondary | ICD-10-CM

## 2012-10-24 DIAGNOSIS — W010XXA Fall on same level from slipping, tripping and stumbling without subsequent striking against object, initial encounter: Secondary | ICD-10-CM

## 2012-10-24 DIAGNOSIS — W2203XA Walked into furniture, initial encounter: Secondary | ICD-10-CM | POA: Insufficient documentation

## 2012-10-24 DIAGNOSIS — Y92009 Unspecified place in unspecified non-institutional (private) residence as the place of occurrence of the external cause: Secondary | ICD-10-CM | POA: Insufficient documentation

## 2012-10-24 LAB — URINALYSIS, ROUTINE W REFLEX MICROSCOPIC
Leukocytes, UA: NEGATIVE
Protein, ur: NEGATIVE mg/dL
Urobilinogen, UA: 0.2 mg/dL (ref 0.0–1.0)

## 2012-10-24 NOTE — MAU Provider Note (Signed)
Chief Complaint:  Fall   First Provider Initiated Contact with Patient 10/24/12 1519      HPI: Lisa Berg is a 27 y.o. G1P0 at [redacted]w[redacted]d who presents to maternity admissions reporting Abdominal trauma that occurred approximately 2 hours prior to arrival. She states she was sitting on the floor holding the child and stood up losing her balance and struck her right upper abdomen on the table. The area of impact is sore. Denies other injury Denies contractions, leakage of fluid or vaginal bleeding. Good fetal movement.   Pregnancy Course: Uncomplicated. A positive blood type.  Past Medical History: Past Medical History  Diagnosis Date  . Migraine   . Allergy     Past obstetric history: OB History  Gravida Para Term Preterm AB SAB TAB Ectopic Multiple Living  1             # Outcome Date GA Lbr Len/2nd Weight Sex Delivery Anes PTL Lv  1 CUR               Past Surgical History: Past Surgical History  Procedure Laterality Date  . Wisdom tooth extraction       Family History: Family History  Problem Relation Age of Onset  . Cancer Mother   . Depression Mother   . Hyperlipidemia Mother   . Hyperlipidemia Father   . Heart disease Father   . Hypertension Father   . Hyperlipidemia Sister   . Stroke Maternal Aunt   . Miscarriages / Stillbirths Maternal Grandmother   . Diabetes Maternal Grandfather   . Heart disease Maternal Grandfather   . Asthma Neg Hx   . Birth defects Neg Hx   . COPD Neg Hx   . Drug abuse Neg Hx   . Hearing loss Neg Hx   . Kidney disease Neg Hx   . Learning disabilities Neg Hx     Social History: History  Substance Use Topics  . Smoking status: Never Smoker   . Smokeless tobacco: Not on file  . Alcohol Use: No    Allergies:  Allergies  Allergen Reactions  . Bee Venom Swelling  . Latex   . Penicillins Rash    Meds:  Prescriptions prior to admission  Medication Sig Dispense Refill  . acetaminophen (TYLENOL) 500 MG tablet Take  1,000 mg by mouth every 6 (six) hours as needed for pain.      . Cyanocobalamin (VITAMIN B 12 PO) Take 1 tablet by mouth daily.      Tery Sanfilippo Calcium (STOOL SOFTENER PO) Take 1 tablet by mouth daily.      . Prenatal Vit-Fe Fumarate-FA (PRENATAL MULTIVITAMIN) TABS tablet Take 1 tablet by mouth daily at 12 noon.      Marland Kitchen EPINEPHrine (EPI-PEN) 0.3 mg/0.3 mL DEVI Inject 0.3 mLs (0.3 mg total) into the muscle once as needed (Use as directed for allergy reaction.).  1 Device  3    ROS: Pertinent findings in history of present illness.  Physical Exam  Blood pressure 114/75, pulse 94, temperature 97.9 F (36.6 C), temperature source Oral, resp. rate 18, last menstrual period 04/29/2012. GENERAL: Well-developed, well-nourished female in no acute distress.  HEENT: normocephalic HEART: normal rate RESP: normal effort ABDOMEN: Soft, gravid appropriate for gestational age. Mildly tender area of erythema, slight abrasion and edema RUQ subcostal margin. Fundus NT. EXTREMITIES: Nontender, no edema NEURO: alert and oriented SPECULUM EXAM: NEFG, physiologic discharge, no blood, cervix clean    FHT:  Baseline 140 , moderate variability, accelerations  present, no decelerations Contractions: none   Labs: Results for orders placed during the hospital encounter of 10/24/12 (from the past 24 hour(s))  URINALYSIS, ROUTINE W REFLEX MICROSCOPIC     Status: Abnormal   Collection Time    10/24/12  2:30 PM      Result Value Range   Color, Urine STRAW (*) YELLOW   APPearance CLEAR  CLEAR   Specific Gravity, Urine <1.005 (*) 1.005 - 1.030   pH 6.5  5.0 - 8.0   Glucose, UA NEGATIVE  NEGATIVE mg/dL   Hgb urine dipstick NEGATIVE  NEGATIVE   Bilirubin Urine NEGATIVE  NEGATIVE   Ketones, ur NEGATIVE  NEGATIVE mg/dL   Protein, ur NEGATIVE  NEGATIVE mg/dL   Urobilinogen, UA 0.2  0.0 - 1.0 mg/dL   Nitrite NEGATIVE  NEGATIVE   Leukocytes, UA NEGATIVE  NEGATIVE    Imaging:  No results found. MAU Course: Ice to  affected area 1545: message left on AM Dr. Renaldo Fiddler 1600: Care assumed by Venia Carbon NP [phone]: Discussed patients presentation with Dr. Renaldo Fiddler; pt experiencing no vaginal bleeding, no contractions on the monitor, baby is reactive.  Fetal Tracing: Baseline: 145 bpm  Variability: Moderate  Accelerations: 10x10 Decelerations: None Toco: No contractions   Assessment: G1 at [redacted]w[redacted]d 1. Pregnancy with abdominal wall injury, antepartum    Plan: Discharge home Return to MAU with any pain or vaginal bleeding Ok to take tylenol as needed, as directed on the bottle Ok to ice your abdomen as needed  Iona Hansen Lynnet Hefley, NP 10/24/2012 4:37 PM    Deirdre Colin Mulders, CNM 10/24/2012 3:36 PM

## 2012-10-24 NOTE — MAU Note (Signed)
Pt fell @ 1400, getting up from floor holding a child, fell & hit RUQ on table.  Pt denies bleeding or LOF, denies pain.

## 2012-11-19 ENCOUNTER — Inpatient Hospital Stay (HOSPITAL_COMMUNITY)
Admission: AD | Admit: 2012-11-19 | Discharge: 2012-11-19 | Disposition: A | Discharge status: Home / Self Care | Payer: No Typology Code available for payment source | Source: Ambulatory Visit | Attending: Obstetrics and Gynecology | Admitting: Obstetrics and Gynecology

## 2012-11-19 ENCOUNTER — Encounter (HOSPITAL_COMMUNITY): Payer: Self-pay | Admitting: *Deleted

## 2012-11-19 DIAGNOSIS — O26879 Cervical shortening, unspecified trimester: Secondary | ICD-10-CM | POA: Insufficient documentation

## 2012-11-19 DIAGNOSIS — R109 Unspecified abdominal pain: Secondary | ICD-10-CM | POA: Insufficient documentation

## 2012-11-19 MED ORDER — BETAMETHASONE SOD PHOS & ACET 6 (3-3) MG/ML IJ SUSP
12.0000 mg | Freq: Once | INTRAMUSCULAR | Status: AC
  Administered 2012-11-19: 12 mg via INTRAMUSCULAR
  Filled 2012-11-19: qty 2

## 2012-11-19 NOTE — MAU Note (Signed)
Pt states she has had some mild lower abd cramping intermittently today.

## 2012-11-19 NOTE — MAU Note (Signed)
Pt sent from MD office for BMZ & moitoring, short cervix dx'd on U/S today.

## 2012-11-20 ENCOUNTER — Inpatient Hospital Stay (HOSPITAL_COMMUNITY)
Admission: AD | Admit: 2012-11-20 | Discharge: 2012-11-20 | Disposition: A | Discharge status: Home / Self Care | Payer: No Typology Code available for payment source | Source: Ambulatory Visit | Attending: Obstetrics and Gynecology | Admitting: Obstetrics and Gynecology

## 2012-11-20 ENCOUNTER — Inpatient Hospital Stay (HOSPITAL_COMMUNITY): Payer: No Typology Code available for payment source

## 2012-11-20 DIAGNOSIS — O4700 False labor before 37 completed weeks of gestation, unspecified trimester: Secondary | ICD-10-CM | POA: Insufficient documentation

## 2012-11-20 MED ORDER — BETAMETHASONE SOD PHOS & ACET 6 (3-3) MG/ML IJ SUSP
12.0000 mg | Freq: Once | INTRAMUSCULAR | Status: AC
  Administered 2012-11-20: 12 mg via INTRAMUSCULAR
  Filled 2012-11-20: qty 2

## 2012-11-20 NOTE — MAU Provider Note (Signed)
S: 27 y.o. G1P0 @[redacted]w[redacted]d  presents to MAU sent from office for BMZ injection, monitoring, and evaluation for PTL.  She reports good fetal movement, denies LOF, vaginal bleeding, vaginal itching/burning, urinary symptoms, h/a, dizziness, n/v, or fever/chills.    O: BP 113/68  Pulse 96  Temp(Src) 98.7 F (37.1 C) (Oral)  Resp 16  LMP 04/29/2012  Physical Examination: General appearance - alert, well appearing, and in no distress Respirations unlabored Heartrate regular  FHR baseline 140 with positive accels, no decels, moderate variability No contractions on toco or to palpation  A: Threatened preterm labor  P: Medical screen performed RN to call Dr Corky Sing Certified Nurse-Midwife

## 2012-11-20 NOTE — MAU Note (Signed)
Patient to MAU for 2nd dose of BMZ. Patient called Dr. Henderson Cloud and very concerned about the cervical exam on ultrasound yesterday. Orders for monitoring and a repeat ultrasound received. Patient states she has some Braxton Hicks last night, none today. Denies bleeding or leaking and reports fetal movement but not as much as usual.

## 2012-12-02 ENCOUNTER — Encounter (HOSPITAL_COMMUNITY): Payer: Self-pay

## 2012-12-02 ENCOUNTER — Inpatient Hospital Stay (HOSPITAL_COMMUNITY): Payer: No Typology Code available for payment source

## 2012-12-02 ENCOUNTER — Inpatient Hospital Stay (HOSPITAL_COMMUNITY)
Admission: AD | Admit: 2012-12-02 | Discharge: 2012-12-02 | Disposition: A | Discharge status: Home / Self Care | Payer: No Typology Code available for payment source | Source: Ambulatory Visit | Attending: Obstetrics and Gynecology | Admitting: Obstetrics and Gynecology

## 2012-12-02 DIAGNOSIS — O4700 False labor before 37 completed weeks of gestation, unspecified trimester: Secondary | ICD-10-CM | POA: Insufficient documentation

## 2012-12-02 DIAGNOSIS — O3430 Maternal care for cervical incompetence, unspecified trimester: Secondary | ICD-10-CM

## 2012-12-02 DIAGNOSIS — O3433 Maternal care for cervical incompetence, third trimester: Secondary | ICD-10-CM

## 2012-12-02 MED ORDER — PROGESTERONE MICRONIZED 100 MG PO CAPS: ORAL_CAPSULE | ORAL | Status: DC

## 2012-12-02 NOTE — MAU Note (Signed)
Patient states she was in the office today for an office visit and sent to MAU for monitoring.Marland Kitchen Has had a shortening cervix for a while. States she has had some minor cramping and passed her mucus plug this past weekend. Reports good fetal movement.

## 2012-12-02 NOTE — MAU Provider Note (Signed)
History     CSN: [id number]  Arrival date and time: 12/02/12 1309   None     No chief complaint on file.  HPI Lisa Berg is 27 y.o. G1P0 [redacted]w[redacted]d weeks presenting after being seen in the office today by Dr. Henderson Cloud.  Was sent to MAU for ultrasound to measure cervical length and monitoring.  She is comfortable at this time.  She denies vaginal bleeding.  Reports she lost her mucous plug 3 days ago.  She perceives fetal movements at this time.      Past Medical History  Diagnosis Date  . Migraine   . Allergy     Past Surgical History  Procedure Laterality Date  . Wisdom tooth extraction      Family History  Problem Relation Age of Onset  . Cancer Mother   . Depression Mother   . Hyperlipidemia Mother   . Hyperlipidemia Father   . Heart disease Father   . Hypertension Father   . Hyperlipidemia Sister   . Stroke Maternal Aunt   . Miscarriages / Stillbirths Maternal Grandmother   . Diabetes Maternal Grandfather   . Heart disease Maternal Grandfather   . Asthma Neg Hx   . Birth defects Neg Hx   . COPD Neg Hx   . Drug abuse Neg Hx   . Hearing loss Neg Hx   . Kidney disease Neg Hx   . Learning disabilities Neg Hx     History  Substance Use Topics  . Smoking status: Never Smoker   . Smokeless tobacco: Not on file  . Alcohol Use: No    Allergies:  Allergies  Allergen Reactions  . Bee Venom Swelling  . Latex Rash  . Penicillins Rash    Prescriptions prior to admission  Medication Sig Dispense Refill  . acetaminophen (TYLENOL) 325 MG tablet Take 325 mg by mouth every 6 (six) hours as needed.      . Cyanocobalamin (VITAMIN B 12 PO) Take 1 tablet by mouth at bedtime.       Tery Sanfilippo Calcium (STOOL SOFTENER PO) Take 2 tablets by mouth at bedtime.       . Prenatal Vit-Fe Fumarate-FA (PRENATAL MULTIVITAMIN) TABS tablet Take 1 tablet by mouth at bedtime.       Marland Kitchen EPINEPHrine (EPI-PEN) 0.3 mg/0.3 mL DEVI Inject 0.3 mLs (0.3 mg total) into the muscle once as  needed (Use as directed for allergy reaction.).  1 Device  3    Review of Systems  Constitutional: Negative for fever and chills.  Gastrointestinal: Negative for nausea and vomiting.  Genitourinary:       " soft cervix" per patient.  Neg for bleeding  Neurological: Negative for headaches.   Physical Exam   Blood pressure 113/70, pulse 101, temperature 99.2 F (37.3 C), temperature source Oral, resp. rate 16, height 5' (1.524 m), weight 143 lb 9.6 oz (65.137 kg), last menstrual period 04/29/2012, SpO2 100.00%.  Physical Exam  Constitutional: She is oriented to person, place, and time. She appears well-developed and well-nourished. No distress.  Neurological: She is alert and oriented to person, place, and time.  Skin: Skin is warm and dry.        MAU Course  Procedures  MDM Ultrasound report and FMS findings were reported by C. Charmian Muff, RN to Dr. Henderson Cloud.  Order given for Prometrium 100mg  vaginally at hs, modified bedrest and pelvic rest.    Assessment and Plan  A:  Cervical length 1.4 cm with dynamic changes at  [redacted]w[redacted]d      Has had betamethasone  P:  Orders given by Dr. Henderson Cloud for pelvic rest, and modified best rest, and PROMETRIUM 100mg  po at hs      Follow up in the office in 2 days  Molli Gethers,EVE M 12/02/2012, 2:03 PM

## 2012-12-06 ENCOUNTER — Observation Stay (HOSPITAL_COMMUNITY)
Admission: AD | Admit: 2012-12-06 | Discharge: 2012-12-08 | Disposition: A | Discharge status: Home / Self Care | Payer: No Typology Code available for payment source | Source: Ambulatory Visit | Attending: Obstetrics & Gynecology | Admitting: Obstetrics & Gynecology

## 2012-12-06 ENCOUNTER — Encounter (HOSPITAL_COMMUNITY): Payer: Self-pay | Admitting: *Deleted

## 2012-12-06 DIAGNOSIS — O4700 False labor before 37 completed weeks of gestation, unspecified trimester: Principal | ICD-10-CM | POA: Insufficient documentation

## 2012-12-06 DIAGNOSIS — O26879 Cervical shortening, unspecified trimester: Secondary | ICD-10-CM | POA: Insufficient documentation

## 2012-12-06 LAB — URINALYSIS, ROUTINE W REFLEX MICROSCOPIC
Glucose, UA: NEGATIVE mg/dL
Ketones, ur: NEGATIVE mg/dL
Leukocytes, UA: NEGATIVE
Nitrite: NEGATIVE
Protein, ur: NEGATIVE mg/dL

## 2012-12-06 NOTE — MAU Note (Addendum)
I've been having contracitons all day and they are getting stronger. Leaking clear fld since 0900. Hx shortened cervix. States had positive FFN on Monday

## 2012-12-06 NOTE — MAU Provider Note (Signed)
History     CSN: [id number]  Arrival date and time: 12/06/12 2310   First Provider Initiated Contact with Patient 12/06/12 2350      No chief complaint on file.  HPI  Pt is [redacted]w[redacted]d pregnant with hx of threatened preterm labor. Pt has had a shortened cervix.  Pt started feeling ctx this morning and  Had leakage of fluid then and this afternoon, none since.  Pt has been using a pill in her vagina since Monday  Pt was given steriods about 3 weeks ago - pt had positive fetal fibronectin reported to her on Wednesday- 2 days ago. Pt has had pressure with urination, no burning since yesterday. Past Medical History  Diagnosis Date  . Migraine   . Allergy     Past Surgical History  Procedure Laterality Date  . Wisdom tooth extraction      Family History  Problem Relation Age of Onset  . Cancer Mother   . Depression Mother   . Hyperlipidemia Mother   . Hyperlipidemia Father   . Heart disease Father   . Hypertension Father   . Hyperlipidemia Sister   . Stroke Maternal Aunt   . Miscarriages / Stillbirths Maternal Grandmother   . Diabetes Maternal Grandfather   . Heart disease Maternal Grandfather   . Asthma Neg Hx   . Birth defects Neg Hx   . COPD Neg Hx   . Drug abuse Neg Hx   . Hearing loss Neg Hx   . Kidney disease Neg Hx   . Learning disabilities Neg Hx     History  Substance Use Topics  . Smoking status: Never Smoker   . Smokeless tobacco: Not on file  . Alcohol Use: No    Allergies:  Allergies  Allergen Reactions  . Bee Venom Swelling  . Latex Rash  . Orange Concentrate [Flavoring Agent] Rash  . Penicillins Rash    Prescriptions prior to admission  Medication Sig Dispense Refill  . acetaminophen (TYLENOL) 500 MG tablet Take 1,000 mg by mouth every 6 (six) hours as needed for mild pain.      . Cyanocobalamin (VITAMIN B 12 PO) Take 1 tablet by mouth at bedtime.       Tery Sanfilippo Calcium (STOOL SOFTENER PO) Take 2 tablets by mouth at bedtime.       .  Prenatal Vit-Fe Fumarate-FA (PRENATAL MULTIVITAMIN) TABS tablet Take 1 tablet by mouth at bedtime.       . progesterone (PROMETRIUM) 100 MG capsule 1 caplet vaginally at hs  30 capsule  0  . EPINEPHrine (EPI-PEN) 0.3 mg/0.3 mL DEVI Inject 0.3 mLs (0.3 mg total) into the muscle once as needed (Use as directed for allergy reaction.).  1 Device  3    ROS Physical Exam   Blood pressure 125/76, pulse 104, temperature 98.5 F (36.9 C), resp. rate 20, height 5' (1.524 m), weight 66.86 kg (147 lb 6.4 oz), last menstrual period 04/29/2012.  Physical Exam  Nursing note and vitals reviewed. Constitutional: She is oriented to person, place, and time. She appears well-developed and well-nourished. No distress.  HENT:  Head: Normocephalic.  Eyes: Pupils are equal, round, and reactive to light.  Neck: Normal range of motion. Neck supple.  Cardiovascular: Normal rate.   Respiratory: Effort normal.  GI: Soft.  Initially when pt arrived, pt had a contraction and then had irregular ctx  Genitourinary:  Cervix dilated 1-1.5cm. ~50% effaced. Mucous discharge in vault; no pooling of fluid;  FERN negative  Musculoskeletal: Normal range of motion.  Neurological: She is alert and oriented to person, place, and time.  Skin: Skin is warm and dry.  Psychiatric: She has a normal mood and affect.    MAU Course  Procedures Results for orders placed during the hospital encounter of 12/06/12 (from the past 24 hour(s))  URINALYSIS, ROUTINE W REFLEX MICROSCOPIC     Status: None   Collection Time    12/06/12 11:23 PM      Result Value Range   Color, Urine YELLOW  YELLOW   APPearance CLEAR  CLEAR   Specific Gravity, Urine 1.010  1.005 - 1.030   pH 7.0  5.0 - 8.0   Glucose, UA NEGATIVE  NEGATIVE mg/dL   Hgb urine dipstick NEGATIVE  NEGATIVE   Bilirubin Urine NEGATIVE  NEGATIVE   Ketones, ur NEGATIVE  NEGATIVE mg/dL   Protein, ur NEGATIVE  NEGATIVE mg/dL   Urobilinogen, UA 0.2  0.0 - 1.0 mg/dL   Nitrite  NEGATIVE  NEGATIVE   Leukocytes, UA NEGATIVE  NEGATIVE  POCT FERN TEST     Status: None   Collection Time    12/07/12 12:22 AM      Result Value Range   POCT Fern Test Negative = intact amniotic membranes    AMNISURE RUPTURE OF MEMBRANE (ROM)     Status: None   Collection Time    12/07/12 12:55 AM      Result Value Range   Amnisure ROM NEGATIVE    discussed with Dr. Langston Masker- Pt had irregular ctx- dilation is new-pt will be kept for observation Orders per Dr. Langston Masker Assessment and Plan    Lisa Berg 12/06/2012, 11:51 PM .Redmond Pulling;

## 2012-12-07 ENCOUNTER — Encounter (HOSPITAL_COMMUNITY): Payer: Self-pay | Admitting: *Deleted

## 2012-12-07 ENCOUNTER — Observation Stay (HOSPITAL_COMMUNITY): Payer: No Typology Code available for payment source

## 2012-12-07 DIAGNOSIS — O4700 False labor before 37 completed weeks of gestation, unspecified trimester: Secondary | ICD-10-CM | POA: Diagnosis not present

## 2012-12-07 LAB — CBC
HCT: 35.6 % — ABNORMAL LOW (ref 36.0–46.0)
MCH: 30.7 pg (ref 26.0–34.0)
MCV: 89.4 fL (ref 78.0–100.0)
Platelets: 214 10*3/uL (ref 150–400)
RDW: 13.4 % (ref 11.5–15.5)
WBC: 13.2 10*3/uL — ABNORMAL HIGH (ref 4.0–10.5)

## 2012-12-07 MED ORDER — DOCUSATE SODIUM 100 MG PO CAPS
100.0000 mg | ORAL_CAPSULE | Freq: Every day | ORAL | Status: DC
  Administered 2012-12-07: 100 mg via ORAL
  Filled 2012-12-07: qty 1

## 2012-12-07 MED ORDER — PRENATAL MULTIVITAMIN CH
1.0000 | ORAL_TABLET | Freq: Every day | ORAL | Status: DC
  Administered 2012-12-07: 1 via ORAL
  Filled 2012-12-07: qty 1

## 2012-12-07 MED ORDER — SODIUM CHLORIDE 0.9 % IJ SOLN
3.0000 mL | Freq: Two times a day (BID) | INTRAMUSCULAR | Status: DC
  Administered 2012-12-07 – 2012-12-08 (×2): 3 mL via INTRAVENOUS

## 2012-12-07 MED ORDER — ZOLPIDEM TARTRATE 5 MG PO TABS: 5.0000 mg | ORAL_TABLET | Freq: Every evening | ORAL | Status: DC | PRN

## 2012-12-07 MED ORDER — CALCIUM CARBONATE ANTACID 500 MG PO CHEW: 2.0000 | CHEWABLE_TABLET | ORAL | Status: DC | PRN

## 2012-12-07 MED ORDER — SODIUM CHLORIDE 0.9 % IJ SOLN: 3.0000 mL | INTRAMUSCULAR | Status: DC | PRN

## 2012-12-07 MED ORDER — ACETAMINOPHEN 325 MG PO TABS: 650.0000 mg | ORAL_TABLET | ORAL | Status: DC | PRN

## 2012-12-07 MED ORDER — PROGESTERONE MICRONIZED 200 MG PO CAPS
200.0000 mg | ORAL_CAPSULE | Freq: Every day | ORAL | Status: DC
  Administered 2012-12-07 (×2): 200 mg via VAGINAL
  Filled 2012-12-07 (×4): qty 1

## 2012-12-07 NOTE — Plan of Care (Signed)
Problem: Consults Goal: Birthing Suites Patient Information Press F2 to bring up selections list Outcome: Completed/Met Date Met:  12/07/12  Pt < [redacted] weeks EGA

## 2012-12-07 NOTE — MAU Note (Signed)
Report called to [facility] in [facility]. Will place SL and get u/s before pt goes to [facility]

## 2012-12-07 NOTE — H&P (Signed)
Lisa Berg is a 27 y.o. female presenting for onset of intense contractions last night.  She reports runs of contractions being normal for her this pregnancy however, last night they were more intense.  They have decreased in intensity since admission.  She denies abnormal discharge except what is normal for her with vaginal progesterone use.  She denies vaginal bleeding nor leakage of fluid.  She reports good fm.  Her pregnancy has been complicated by shortened cervix with last length measured 11/24 at 1.4 cm.  She received BMZ 11/11 and 12.  On admission, her cvx was 1-2/50/-2.  Her previous vaginal exam was closed but soft.    Maternal Medical History:  Reason for admission: Contractions.   Contractions: Onset was 6-12 hours ago.   Frequency: irregular.   Perceived severity is mild.    Fetal activity: Perceived fetal activity is normal.   Last perceived fetal movement was within the past hour.    Prenatal complications: no prenatal complications Prenatal Complications - Diabetes: none.    OB History   Grav Para Term Preterm Abortions TAB SAB Ect Mult Living   1              Past Medical History  Diagnosis Date  . Migraine   . Allergy    Past Surgical History  Procedure Laterality Date  . Wisdom tooth extraction     Family History: family history includes Cancer in her mother; Depression in her mother; Diabetes in her maternal grandfather; Heart disease in her father and maternal grandfather; Hyperlipidemia in her father, mother, and sister; Hypertension in her father; Miscarriages / Stillbirths in her maternal grandmother; Stroke in her maternal aunt. There is no history of Asthma, Birth defects, COPD, Drug abuse, Hearing loss, Kidney disease, or Learning disabilities. Social History:  reports that she has never smoked. She does not have any smokeless tobacco history on file. She reports that she does not drink alcohol or use illicit drugs.   Prenatal Transfer  Tool  Maternal Diabetes: No Genetic Screening: Normal Maternal Ultrasounds/Referrals: Normal Fetal Ultrasounds or other Referrals:  None Maternal Substance Abuse:  No Significant Maternal Medications:  None Significant Maternal Lab Results:  None Other Comments:  None  ROS  Dilation: 1.5 Effacement (%): 50 Station: -2 Exam by:: Lisa Berg Blood pressure 106/67, pulse 89, temperature 97.9 F (36.6 C), temperature source Oral, resp. rate 16, height 5' (1.524 m), weight 147 lb 6.4 oz (66.86 kg), last menstrual period 04/29/2012. Maternal Exam:  Uterine Assessment: Contraction strength is mild.  Contraction frequency is irregular.   Abdomen: Patient reports no abdominal tenderness. Fundal height is c/w dates.   Estimated fetal weight is 4#.   Fetal presentation: vertex  Introitus: Normal vulva. Normal vagina.  Pelvis: adequate for delivery.   Cervix: Cervix evaluated by digital exam.     Physical Exam  Constitutional: She is oriented to person, place, and time. She appears well-developed and well-nourished.  GI: Soft. There is no rebound and no guarding.  Neurological: She is alert and oriented to person, place, and time.  Skin: Skin is warm and dry.  Psychiatric: She has a normal mood and affect. Her behavior is normal.    Prenatal labs: ABO, Rh: A/Positive/-- (06/18 0000) Antibody: Negative (06/18 0000) Rubella: Immune (06/18 0000) RPR: Nonreactive (06/18 0000)  HBsAg: Negative (06/18 0000)  HIV: Non-reactive (06/18 0000)  GBS:     Assessment/Plan: 26yo G1 at 105w6d with preterm labor -s/p BMZ (mature) -continuous monitoring -bedrest -Add MagSO4  if CTX increase in intensity again -Recheck cvx tomorrow AM and possible D/C if unchanged   [name] 12/07/2012, 8:49 AM

## 2012-12-07 NOTE — MAU Note (Signed)
Pt will go to 173 from u/s

## 2012-12-07 NOTE — Progress Notes (Signed)
Utilization Review completed.  

## 2012-12-07 NOTE — MAU Note (Signed)
Saline lock flushed well with 10cc NS x 2. Labs drawn with SL start

## 2012-12-07 NOTE — Progress Notes (Signed)
OK to d/c efm per S. Chase Picket NP

## 2012-12-07 NOTE — MAU Note (Signed)
Lupita Leash RN in NICU notified of pt's admission

## 2012-12-07 NOTE — Progress Notes (Signed)
Aminsure collected and to lab

## 2012-12-08 DIAGNOSIS — O4700 False labor before 37 completed weeks of gestation, unspecified trimester: Secondary | ICD-10-CM | POA: Diagnosis not present

## 2012-12-08 MED ORDER — NIFEDIPINE 10 MG PO CAPS: 10.0000 mg | ORAL_CAPSULE | Freq: Four times a day (QID) | ORAL | Status: DC | PRN

## 2012-12-08 MED ORDER — NIFEDIPINE 10 MG PO CAPS
20.0000 mg | ORAL_CAPSULE | Freq: Once | ORAL | Status: AC
  Administered 2012-12-08: 20 mg via ORAL
  Filled 2012-12-08: qty 2

## 2012-12-08 NOTE — Progress Notes (Signed)
No change in status.  Patient reports her pain is similar to previous weeks in pregnancy.  She is uncomfortable.  No VB, no LOF, +FM.    VSS.  Gen: A&O x 3 Abd: soft, NT Ext: no c/c/e Pelvic: cvx 1-2/50/-2 (unchanged)  26yo G1 at [redacted]w[redacted]d with PTL, arrested -s/p BMZ -Will try Procardia for symptoms -Will get NST prior to d/c -Appt in office tomorrow already scheduled -Counseled to return with change in symptoms

## 2012-12-08 NOTE — Discharge Summary (Signed)
Obstetric Discharge Summary Reason for Admission: preterm labor Prenatal Procedures: none Intrapartum Procedures: none Postpartum Procedures: none Complications-Operative and Postpartum: none Hemoglobin  Date Value Range Status  12/07/2012 12.2  12.0 - 15.0 g/dL Final     HCT  Date Value Range Status  12/07/2012 35.6* 36.0 - 46.0 % Final    Physical Exam:  General: alert, cooperative and appears stated age 27: n/a Uterine Fundus: soft Incision: n/a DVT Evaluation: No evidence of DVT seen on physical exam. Negative Homan's sign. No cords or calf tenderness.  Discharge Diagnoses: preterm labor  Discharge Information: Date: 12/08/2012 Activity: pelvic rest and modified bedrest Diet: routine Medications: PNV, Colace and Procardia and vaginal progesterone Condition: stable Instructions: refer to practice specific booklet Discharge to: home   Newborn Data: <<This patient has not yet delivered during this pregnancy.>> Home with n/a.  Lisa Berg 12/08/2012, 12:50 PM

## 2012-12-08 NOTE — Progress Notes (Signed)
Utilization Review completed.  

## 2013-01-03 ENCOUNTER — Encounter (HOSPITAL_COMMUNITY)
Admission: AD | Disposition: A | Discharge status: Home / Self Care | Payer: Self-pay | Source: Ambulatory Visit | Attending: Obstetrics and Gynecology

## 2013-01-03 ENCOUNTER — Encounter (HOSPITAL_COMMUNITY): Payer: Self-pay | Admitting: *Deleted

## 2013-01-03 ENCOUNTER — Inpatient Hospital Stay (HOSPITAL_COMMUNITY): Payer: No Typology Code available for payment source | Admitting: Anesthesiology

## 2013-01-03 ENCOUNTER — Encounter (HOSPITAL_COMMUNITY): Payer: No Typology Code available for payment source | Admitting: Anesthesiology

## 2013-01-03 ENCOUNTER — Inpatient Hospital Stay (HOSPITAL_COMMUNITY)
Admission: AD | Admit: 2013-01-03 | Discharge: 2013-01-06 | DRG: 765 | Disposition: A | Discharge status: Home / Self Care | Payer: No Typology Code available for payment source | Source: Ambulatory Visit | Attending: Obstetrics and Gynecology | Admitting: Obstetrics and Gynecology

## 2013-01-03 DIAGNOSIS — O26879 Cervical shortening, unspecified trimester: Secondary | ICD-10-CM | POA: Diagnosis present

## 2013-01-03 DIAGNOSIS — Z880 Allergy status to penicillin: Secondary | ICD-10-CM | POA: Diagnosis not present

## 2013-01-03 DIAGNOSIS — O479 False labor, unspecified: Secondary | ICD-10-CM | POA: Diagnosis present

## 2013-01-03 LAB — URINALYSIS, ROUTINE W REFLEX MICROSCOPIC
Glucose, UA: NEGATIVE mg/dL
Hgb urine dipstick: NEGATIVE
Ketones, ur: NEGATIVE mg/dL
Leukocytes, UA: NEGATIVE
Protein, ur: NEGATIVE mg/dL
Specific Gravity, Urine: 1.015 (ref 1.005–1.030)
pH: 7 (ref 5.0–8.0)

## 2013-01-03 LAB — RPR: RPR Ser Ql: NONREACTIVE

## 2013-01-03 LAB — CBC
HCT: 39.3 % (ref 36.0–46.0)
Hemoglobin: 13.6 g/dL (ref 12.0–15.0)
MCH: 30.7 pg (ref 26.0–34.0)
MCHC: 34.6 g/dL (ref 30.0–36.0)

## 2013-01-03 SURGERY — Surgical Case
Anesthesia: Epidural | Site: Abdomen

## 2013-01-03 MED ORDER — ONDANSETRON HCL 4 MG/2ML IJ SOLN: 4.0000 mg | Freq: Three times a day (TID) | INTRAMUSCULAR | Status: DC | PRN

## 2013-01-03 MED ORDER — PHENYLEPHRINE 40 MCG/ML (10ML) SYRINGE FOR IV PUSH (FOR BLOOD PRESSURE SUPPORT)
80.0000 ug | PREFILLED_SYRINGE | INTRAVENOUS | Status: DC | PRN
  Administered 2013-01-03 (×2): 80 ug via INTRAVENOUS
  Filled 2013-01-03: qty 10

## 2013-01-03 MED ORDER — OXYCODONE-ACETAMINOPHEN 5-325 MG PO TABS
1.0000 | ORAL_TABLET | ORAL | Status: DC | PRN
  Administered 2013-01-04 (×2): 2 via ORAL
  Administered 2013-01-04 (×2): 1 via ORAL
  Administered 2013-01-05 – 2013-01-06 (×6): 2 via ORAL
  Filled 2013-01-03: qty 1
  Filled 2013-01-03 (×6): qty 2
  Filled 2013-01-03: qty 1
  Filled 2013-01-03 (×2): qty 2

## 2013-01-03 MED ORDER — DIPHENHYDRAMINE HCL 50 MG/ML IJ SOLN: 12.5000 mg | INTRAMUSCULAR | Status: DC | PRN

## 2013-01-03 MED ORDER — SIMETHICONE 80 MG PO CHEW: 80.0000 mg | CHEWABLE_TABLET | ORAL | Status: DC

## 2013-01-03 MED ORDER — ONDANSETRON HCL 4 MG/2ML IJ SOLN: 4.0000 mg | INTRAMUSCULAR | Status: DC | PRN

## 2013-01-03 MED ORDER — IBUPROFEN 600 MG PO TABS: 600.0000 mg | ORAL_TABLET | Freq: Four times a day (QID) | ORAL | Status: DC

## 2013-01-03 MED ORDER — PHENYLEPHRINE 40 MCG/ML (10ML) SYRINGE FOR IV PUSH (FOR BLOOD PRESSURE SUPPORT)
PREFILLED_SYRINGE | INTRAVENOUS | Status: AC
  Filled 2013-01-03: qty 5

## 2013-01-03 MED ORDER — MORPHINE SULFATE 0.5 MG/ML IJ SOLN
INTRAMUSCULAR | Status: AC
  Filled 2013-01-03: qty 10

## 2013-01-03 MED ORDER — MENTHOL 3 MG MT LOZG: 1.0000 | LOZENGE | OROMUCOSAL | Status: DC | PRN

## 2013-01-03 MED ORDER — EPHEDRINE 5 MG/ML INJ: 10.0000 mg | INTRAVENOUS | Status: DC | PRN

## 2013-01-03 MED ORDER — FENTANYL CITRATE 0.05 MG/ML IJ SOLN
INTRAMUSCULAR | Status: AC
  Filled 2013-01-03: qty 2

## 2013-01-03 MED ORDER — OXYTOCIN 40 UNITS IN LACTATED RINGERS INFUSION - SIMPLE MED: 62.5000 mL/h | INTRAVENOUS | Status: DC

## 2013-01-03 MED ORDER — DIPHENHYDRAMINE HCL 25 MG PO CAPS: 25.0000 mg | ORAL_CAPSULE | Freq: Four times a day (QID) | ORAL | Status: DC | PRN

## 2013-01-03 MED ORDER — SCOPOLAMINE 1 MG/3DAYS TD PT72
1.0000 | MEDICATED_PATCH | Freq: Once | TRANSDERMAL | Status: DC
  Administered 2013-01-03: 1.5 mg via TRANSDERMAL

## 2013-01-03 MED ORDER — LACTATED RINGERS IV SOLN
INTRAVENOUS | Status: DC | PRN
  Administered 2013-01-03 (×2): via INTRAVENOUS

## 2013-01-03 MED ORDER — OXYTOCIN 10 UNIT/ML IJ SOLN
INTRAMUSCULAR | Status: AC
  Filled 2013-01-03: qty 4

## 2013-01-03 MED ORDER — SCOPOLAMINE 1 MG/3DAYS TD PT72: 1.0000 | MEDICATED_PATCH | Freq: Once | TRANSDERMAL | Status: DC

## 2013-01-03 MED ORDER — IBUPROFEN 600 MG PO TABS: 600.0000 mg | ORAL_TABLET | Freq: Four times a day (QID) | ORAL | Status: DC | PRN

## 2013-01-03 MED ORDER — ONDANSETRON HCL 4 MG PO TABS: 4.0000 mg | ORAL_TABLET | ORAL | Status: DC | PRN

## 2013-01-03 MED ORDER — LIDOCAINE HCL (PF) 1 % IJ SOLN: 30.0000 mL | INTRAMUSCULAR | Status: DC | PRN

## 2013-01-03 MED ORDER — DIPHENHYDRAMINE HCL 50 MG/ML IJ SOLN: 25.0000 mg | INTRAMUSCULAR | Status: DC | PRN

## 2013-01-03 MED ORDER — OXYTOCIN BOLUS FROM INFUSION: 500.0000 mL | INTRAVENOUS | Status: DC

## 2013-01-03 MED ORDER — TETANUS-DIPHTH-ACELL PERTUSSIS 5-2.5-18.5 LF-MCG/0.5 IM SUSP: 0.5000 mL | Freq: Once | INTRAMUSCULAR | Status: DC

## 2013-01-03 MED ORDER — LACTATED RINGERS IV SOLN
INTRAVENOUS | Status: DC | PRN
  Administered 2013-01-03: 18:00:00 via INTRAVENOUS

## 2013-01-03 MED ORDER — DEXTROSE 5 % IV SOLN: 1.0000 ug/kg/h | INTRAVENOUS | Status: DC | PRN

## 2013-01-03 MED ORDER — SODIUM BICARBONATE 8.4 % IV SOLN
INTRAVENOUS | Status: AC
  Filled 2013-01-03: qty 50

## 2013-01-03 MED ORDER — SIMETHICONE 80 MG PO CHEW
80.0000 mg | CHEWABLE_TABLET | ORAL | Status: DC
  Administered 2013-01-05 (×2): 80 mg via ORAL
  Filled 2013-01-03 (×3): qty 1

## 2013-01-03 MED ORDER — LIDOCAINE HCL (PF) 1 % IJ SOLN
30.0000 mL | INTRAMUSCULAR | Status: DC | PRN
  Filled 2013-01-03: qty 30

## 2013-01-03 MED ORDER — SIMETHICONE 80 MG PO CHEW
80.0000 mg | CHEWABLE_TABLET | Freq: Three times a day (TID) | ORAL | Status: DC
  Administered 2013-01-04 – 2013-01-06 (×7): 80 mg via ORAL
  Filled 2013-01-03 (×7): qty 1

## 2013-01-03 MED ORDER — BUTORPHANOL TARTRATE 1 MG/ML IJ SOLN: 1.0000 mg | INTRAMUSCULAR | Status: DC | PRN

## 2013-01-03 MED ORDER — ACETAMINOPHEN 325 MG PO TABS: 650.0000 mg | ORAL_TABLET | ORAL | Status: DC | PRN

## 2013-01-03 MED ORDER — NALOXONE HCL 0.4 MG/ML IJ SOLN: 0.4000 mg | INTRAMUSCULAR | Status: DC | PRN

## 2013-01-03 MED ORDER — PRENATAL MULTIVITAMIN CH: 1.0000 | ORAL_TABLET | Freq: Every day | ORAL | Status: DC

## 2013-01-03 MED ORDER — ONDANSETRON HCL 4 MG/2ML IJ SOLN
INTRAMUSCULAR | Status: DC | PRN
  Administered 2013-01-03: 4 mg via INTRAVENOUS

## 2013-01-03 MED ORDER — LIDOCAINE HCL (PF) 1 % IJ SOLN
INTRAMUSCULAR | Status: DC | PRN
  Administered 2013-01-03 (×4): 4 mL

## 2013-01-03 MED ORDER — FENTANYL CITRATE 0.05 MG/ML IJ SOLN
25.0000 ug | INTRAMUSCULAR | Status: DC | PRN
  Administered 2013-01-03 (×3): 50 ug via INTRAVENOUS

## 2013-01-03 MED ORDER — DIBUCAINE 1 % RE OINT: 1.0000 "application " | TOPICAL_OINTMENT | RECTAL | Status: DC | PRN

## 2013-01-03 MED ORDER — PRENATAL MULTIVITAMIN CH
1.0000 | ORAL_TABLET | Freq: Every day | ORAL | Status: DC
  Administered 2013-01-04 – 2013-01-06 (×3): 1 via ORAL
  Filled 2013-01-03 (×3): qty 1

## 2013-01-03 MED ORDER — SENNOSIDES-DOCUSATE SODIUM 8.6-50 MG PO TABS: 2.0000 | ORAL_TABLET | ORAL | Status: DC

## 2013-01-03 MED ORDER — OXYTOCIN 40 UNITS IN LACTATED RINGERS INFUSION - SIMPLE MED: 62.5000 mL/h | INTRAVENOUS | Status: AC

## 2013-01-03 MED ORDER — WITCH HAZEL-GLYCERIN EX PADS: 1.0000 "application " | MEDICATED_PAD | CUTANEOUS | Status: DC | PRN

## 2013-01-03 MED ORDER — ONDANSETRON HCL 4 MG PO TABS
4.0000 mg | ORAL_TABLET | ORAL | Status: DC | PRN
  Administered 2013-01-05: 4 mg via ORAL
  Filled 2013-01-03: qty 1

## 2013-01-03 MED ORDER — LACTATED RINGERS IV SOLN: INTRAVENOUS | Status: DC

## 2013-01-03 MED ORDER — SODIUM CHLORIDE 0.9 % IJ SOLN
INTRAMUSCULAR | Status: AC
  Filled 2013-01-03: qty 50

## 2013-01-03 MED ORDER — KETOROLAC TROMETHAMINE 30 MG/ML IJ SOLN: 30.0000 mg | Freq: Four times a day (QID) | INTRAMUSCULAR | Status: AC | PRN

## 2013-01-03 MED ORDER — IBUPROFEN 600 MG PO TABS
600.0000 mg | ORAL_TABLET | Freq: Four times a day (QID) | ORAL | Status: DC
Start: 2013-01-04 — End: 2013-01-06
  Administered 2013-01-04 – 2013-01-06 (×10): 600 mg via ORAL
  Filled 2013-01-03 (×10): qty 1

## 2013-01-03 MED ORDER — NALBUPHINE HCL 10 MG/ML IJ SOLN
5.0000 mg | INTRAMUSCULAR | Status: DC | PRN
  Administered 2013-01-04: 10 mg via SUBCUTANEOUS

## 2013-01-03 MED ORDER — METOCLOPRAMIDE HCL 5 MG/ML IJ SOLN: 10.0000 mg | Freq: Three times a day (TID) | INTRAMUSCULAR | Status: DC | PRN

## 2013-01-03 MED ORDER — MEPERIDINE HCL 25 MG/ML IJ SOLN: 6.2500 mg | INTRAMUSCULAR | Status: DC | PRN

## 2013-01-03 MED ORDER — SENNOSIDES-DOCUSATE SODIUM 8.6-50 MG PO TABS
2.0000 | ORAL_TABLET | ORAL | Status: DC
  Administered 2013-01-05 (×2): 2 via ORAL
  Filled 2013-01-03 (×3): qty 2

## 2013-01-03 MED ORDER — SODIUM CHLORIDE 0.9 % IJ SOLN
INTRAMUSCULAR | Status: DC | PRN
  Administered 2013-01-03: 40 mL via INTRAVENOUS

## 2013-01-03 MED ORDER — VANCOMYCIN HCL IN DEXTROSE 1-5 GM/200ML-% IV SOLN
1000.0000 mg | Freq: Two times a day (BID) | INTRAVENOUS | Status: DC
  Administered 2013-01-03: 1000 mg via INTRAVENOUS
  Filled 2013-01-03 (×2): qty 200

## 2013-01-03 MED ORDER — SODIUM CHLORIDE 0.9 % IJ SOLN: 3.0000 mL | INTRAMUSCULAR | Status: DC | PRN

## 2013-01-03 MED ORDER — DIPHENHYDRAMINE HCL 25 MG PO CAPS
25.0000 mg | ORAL_CAPSULE | ORAL | Status: DC | PRN
  Administered 2013-01-04 – 2013-01-05 (×3): 25 mg via ORAL
  Filled 2013-01-03: qty 1

## 2013-01-03 MED ORDER — ZOLPIDEM TARTRATE 5 MG PO TABS: 5.0000 mg | ORAL_TABLET | Freq: Every evening | ORAL | Status: DC | PRN

## 2013-01-03 MED ORDER — EPHEDRINE 5 MG/ML INJ
10.0000 mg | INTRAVENOUS | Status: DC | PRN
  Filled 2013-01-03: qty 4

## 2013-01-03 MED ORDER — LACTATED RINGERS IV SOLN: 500.0000 mL | INTRAVENOUS | Status: DC | PRN

## 2013-01-03 MED ORDER — OXYTOCIN 10 UNIT/ML IJ SOLN
INTRAMUSCULAR | Status: DC | PRN
  Administered 2013-01-03: 40 [IU]

## 2013-01-03 MED ORDER — KETOROLAC TROMETHAMINE 30 MG/ML IJ SOLN: 30.0000 mg | Freq: Four times a day (QID) | INTRAMUSCULAR | Status: DC | PRN

## 2013-01-03 MED ORDER — HYDROMORPHONE HCL PF 1 MG/ML IJ SOLN
0.5000 mg | Freq: Once | INTRAMUSCULAR | Status: AC
  Administered 2013-01-03: 0.5 mg via INTRAVENOUS
  Filled 2013-01-03: qty 1

## 2013-01-03 MED ORDER — SIMETHICONE 80 MG PO CHEW: 80.0000 mg | CHEWABLE_TABLET | Freq: Three times a day (TID) | ORAL | Status: DC

## 2013-01-03 MED ORDER — CITRIC ACID-SODIUM CITRATE 334-500 MG/5ML PO SOLN: 30.0000 mL | ORAL | Status: DC | PRN

## 2013-01-03 MED ORDER — SIMETHICONE 80 MG PO CHEW: 80.0000 mg | CHEWABLE_TABLET | ORAL | Status: DC | PRN

## 2013-01-03 MED ORDER — HYDROXYZINE HCL 50 MG PO TABS: 50.0000 mg | ORAL_TABLET | Freq: Four times a day (QID) | ORAL | Status: DC | PRN

## 2013-01-03 MED ORDER — NALBUPHINE HCL 10 MG/ML IJ SOLN
5.0000 mg | INTRAMUSCULAR | Status: DC | PRN
  Filled 2013-01-03 (×2): qty 1

## 2013-01-03 MED ORDER — FENTANYL CITRATE 0.05 MG/ML IJ SOLN
INTRAMUSCULAR | Status: AC
  Administered 2013-01-03: 50 ug via INTRAVENOUS
  Filled 2013-01-03: qty 2

## 2013-01-03 MED ORDER — FLEET ENEMA 7-19 GM/118ML RE ENEM: 1.0000 | ENEMA | RECTAL | Status: DC | PRN

## 2013-01-03 MED ORDER — LANOLIN HYDROUS EX OINT: 1.0000 "application " | TOPICAL_OINTMENT | CUTANEOUS | Status: DC | PRN

## 2013-01-03 MED ORDER — VANCOMYCIN HCL IN DEXTROSE 1-5 GM/200ML-% IV SOLN: 1000.0000 mg | Freq: Two times a day (BID) | INTRAVENOUS | Status: DC

## 2013-01-03 MED ORDER — CITRIC ACID-SODIUM CITRATE 334-500 MG/5ML PO SOLN
30.0000 mL | ORAL | Status: DC | PRN
  Administered 2013-01-03: 30 mL via ORAL
  Filled 2013-01-03: qty 15

## 2013-01-03 MED ORDER — ONDANSETRON HCL 4 MG/2ML IJ SOLN: 4.0000 mg | Freq: Four times a day (QID) | INTRAMUSCULAR | Status: DC | PRN

## 2013-01-03 MED ORDER — NALBUPHINE HCL 10 MG/ML IJ SOLN
5.0000 mg | INTRAMUSCULAR | Status: DC | PRN
  Administered 2013-01-03 (×2): 5 mg via INTRAVENOUS
  Filled 2013-01-03: qty 1

## 2013-01-03 MED ORDER — KETOROLAC TROMETHAMINE 60 MG/2ML IM SOLN
60.0000 mg | Freq: Once | INTRAMUSCULAR | Status: AC | PRN
  Administered 2013-01-03: 60 mg via INTRAMUSCULAR

## 2013-01-03 MED ORDER — OXYCODONE-ACETAMINOPHEN 5-325 MG PO TABS: 1.0000 | ORAL_TABLET | ORAL | Status: DC | PRN

## 2013-01-03 MED ORDER — LACTATED RINGERS IV SOLN: 500.0000 mL | Freq: Once | INTRAVENOUS | Status: DC

## 2013-01-03 MED ORDER — PHENYLEPHRINE 40 MCG/ML (10ML) SYRINGE FOR IV PUSH (FOR BLOOD PRESSURE SUPPORT)
80.0000 ug | PREFILLED_SYRINGE | INTRAVENOUS | Status: DC | PRN
  Administered 2013-01-03: 80 ug via INTRAVENOUS

## 2013-01-03 MED ORDER — ONDANSETRON HCL 4 MG/2ML IJ SOLN
INTRAMUSCULAR | Status: AC
  Filled 2013-01-03: qty 2

## 2013-01-03 MED ORDER — MEPERIDINE HCL 25 MG/ML IJ SOLN
INTRAMUSCULAR | Status: AC
  Filled 2013-01-03: qty 1

## 2013-01-03 MED ORDER — FENTANYL 2.5 MCG/ML BUPIVACAINE 1/10 % EPIDURAL INFUSION (WH - ANES)
14.0000 mL/h | INTRAMUSCULAR | Status: DC | PRN
  Administered 2013-01-03: 14 mL/h via EPIDURAL
  Filled 2013-01-03: qty 125

## 2013-01-03 MED ORDER — LIDOCAINE-EPINEPHRINE (PF) 2 %-1:200000 IJ SOLN
INTRAMUSCULAR | Status: AC
  Filled 2013-01-03: qty 20

## 2013-01-03 MED ORDER — SCOPOLAMINE 1 MG/3DAYS TD PT72
MEDICATED_PATCH | TRANSDERMAL | Status: AC
  Filled 2013-01-03: qty 1

## 2013-01-03 MED ORDER — KETOROLAC TROMETHAMINE 60 MG/2ML IM SOLN: 60.0000 mg | Freq: Once | INTRAMUSCULAR | Status: AC | PRN

## 2013-01-03 MED ORDER — DIPHENHYDRAMINE HCL 25 MG PO CAPS
25.0000 mg | ORAL_CAPSULE | ORAL | Status: DC | PRN
  Filled 2013-01-03: qty 1

## 2013-01-03 MED ORDER — SODIUM BICARBONATE 8.4 % IV SOLN
INTRAVENOUS | Status: DC | PRN
  Administered 2013-01-03 (×2): 5 mL via EPIDURAL

## 2013-01-03 MED ORDER — BUPIVACAINE LIPOSOME 1.3 % IJ SUSP
20.0000 mL | Freq: Once | INTRAMUSCULAR | Status: AC
  Administered 2013-01-03: 20 mL
  Filled 2013-01-03: qty 20

## 2013-01-03 MED ORDER — MORPHINE SULFATE (PF) 0.5 MG/ML IJ SOLN
INTRAMUSCULAR | Status: DC | PRN
  Administered 2013-01-03: 3 mg via EPIDURAL

## 2013-01-03 MED ORDER — KETOROLAC TROMETHAMINE 60 MG/2ML IM SOLN
INTRAMUSCULAR | Status: AC
  Filled 2013-01-03: qty 2

## 2013-01-03 MED ORDER — CEFAZOLIN SODIUM-DEXTROSE 2-3 GM-% IV SOLR
INTRAVENOUS | Status: AC
  Filled 2013-01-03: qty 50

## 2013-01-03 MED ORDER — MEPERIDINE HCL 25 MG/ML IJ SOLN
6.2500 mg | INTRAMUSCULAR | Status: DC | PRN
  Administered 2013-01-03: 6.25 mg via INTRAVENOUS

## 2013-01-03 MED ORDER — DIPHENHYDRAMINE HCL 25 MG PO CAPS
25.0000 mg | ORAL_CAPSULE | Freq: Four times a day (QID) | ORAL | Status: DC | PRN
  Filled 2013-01-03 (×2): qty 1

## 2013-01-03 MED ORDER — NALBUPHINE HCL 10 MG/ML IJ SOLN
5.0000 mg | INTRAMUSCULAR | Status: DC | PRN
  Filled 2013-01-03: qty 1

## 2013-01-03 SURGICAL SUPPLY — 35 items
ADH SKN CLS APL DERMABOND .7 (GAUZE/BANDAGES/DRESSINGS)
APL SKNCLS STERI-STRIP NONHPOA (GAUZE/BANDAGES/DRESSINGS) ×1
BENZOIN TINCTURE PRP APPL 2/3 (GAUZE/BANDAGES/DRESSINGS) ×1 IMPLANT
CLAMP CORD UMBIL (MISCELLANEOUS) IMPLANT
CLOTH BEACON ORANGE TIMEOUT ST (SAFETY) ×2 IMPLANT
CONTAINER PREFILL 10% NBF 15ML (MISCELLANEOUS) IMPLANT
DERMABOND ADVANCED (GAUZE/BANDAGES/DRESSINGS)
DERMABOND ADVANCED .7 DNX12 (GAUZE/BANDAGES/DRESSINGS) IMPLANT
DRAPE LG THREE QUARTER DISP (DRAPES) IMPLANT
DRSG OPSITE POSTOP 4X10 (GAUZE/BANDAGES/DRESSINGS) ×2 IMPLANT
DURAPREP 26ML APPLICATOR (WOUND CARE) ×2 IMPLANT
ELECT REM PT RETURN 9FT ADLT (ELECTROSURGICAL) ×2
ELECTRODE REM PT RTRN 9FT ADLT (ELECTROSURGICAL) ×1 IMPLANT
EXTRACTOR VACUUM M CUP 4 TUBE (SUCTIONS) IMPLANT
GLOVE BIO SURGEON STRL SZ7.5 (GLOVE) ×2 IMPLANT
GOWN STRL REIN XL XLG (GOWN DISPOSABLE) ×4 IMPLANT
KIT ABG SYR 3ML LUER SLIP (SYRINGE) ×2 IMPLANT
NDL HYPO 21X1.5 SAFETY (NEEDLE) ×1 IMPLANT
NDL HYPO 25X5/8 SAFETYGLIDE (NEEDLE) ×1 IMPLANT
NEEDLE HYPO 21X1.5 SAFETY (NEEDLE) ×2 IMPLANT
NEEDLE HYPO 25X5/8 SAFETYGLIDE (NEEDLE) ×2 IMPLANT
NS IRRIG 1000ML POUR BTL (IV SOLUTION) ×2 IMPLANT
PACK C SECTION WH (CUSTOM PROCEDURE TRAY) ×2 IMPLANT
PAD OB MATERNITY 4.3X12.25 (PERSONAL CARE ITEMS) ×2 IMPLANT
STAPLER VISISTAT 35W (STAPLE) IMPLANT
STRIP CLOSURE SKIN 1/2X4 (GAUZE/BANDAGES/DRESSINGS) ×1 IMPLANT
SUT MNCRL 0 VIOLET CTX 36 (SUTURE) ×4 IMPLANT
SUT MONOCRYL 0 CTX 36 (SUTURE) ×4
SUT PDS AB 0 CTX 60 (SUTURE) ×2 IMPLANT
SUT PLAIN 0 NONE (SUTURE) IMPLANT
SUT VIC AB 4-0 KS 27 (SUTURE) ×2 IMPLANT
SYR 20CC LL (SYRINGE) ×2 IMPLANT
TOWEL OR 17X24 6PK STRL BLUE (TOWEL DISPOSABLE) ×2 IMPLANT
TRAY FOLEY CATH 14FR (SET/KITS/TRAYS/PACK) ×1 IMPLANT
WATER STERILE IRR 1000ML POUR (IV SOLUTION) ×1 IMPLANT

## 2013-01-03 NOTE — MAU Provider Note (Signed)
Chief Complaint:  Labor Eval   None     HPI: Lisa Berg is a 27 y.o. G1P0 at [redacted]w[redacted]d who presents to maternity admissions reporting contractions every 5 minutes with mucus discharge starting at 5 am this morning.  She reports good fetal movement, denies LOF, vaginal bleeding, vaginal itching/burning, urinary symptoms, h/a, dizziness, n/v, or fever/chills.    Past Medical History: Past Medical History  Diagnosis Date  . Migraine   . Allergy     Past obstetric history: OB History  Gravida Para Term Preterm AB SAB TAB Ectopic Multiple Living  1         0    # Outcome Date GA Lbr Len/2nd Weight Sex Delivery Anes PTL Lv  1 CUR               Past Surgical History: Past Surgical History  Procedure Laterality Date  . Wisdom tooth extraction      Family History: Family History  Problem Relation Age of Onset  . Cancer Mother   . Depression Mother   . Hyperlipidemia Mother   . Hyperlipidemia Father   . Heart disease Father   . Hypertension Father   . Hyperlipidemia Sister   . Stroke Maternal Aunt   . Miscarriages / Stillbirths Maternal Grandmother   . Diabetes Maternal Grandfather   . Heart disease Maternal Grandfather   . Asthma Neg Hx   . Birth defects Neg Hx   . COPD Neg Hx   . Drug abuse Neg Hx   . Hearing loss Neg Hx   . Kidney disease Neg Hx   . Learning disabilities Neg Hx     Social History: History  Substance Use Topics  . Smoking status: Never Smoker   . Smokeless tobacco: Not on file  . Alcohol Use: No    Allergies:  Allergies  Allergen Reactions  . Bee Venom Swelling  . Latex Rash  . Orange Concentrate [Flavoring Agent] Rash  . Penicillins Rash    Meds:  Prescriptions prior to admission  Medication Sig Dispense Refill  . acetaminophen (TYLENOL) 500 MG tablet Take 1,000 mg by mouth every 6 (six) hours as needed for mild pain.      . Cyanocobalamin (VITAMIN B 12 PO) Take 1 tablet by mouth at bedtime.       Tery Sanfilippo Calcium  (STOOL SOFTENER PO) Take 2 tablets by mouth at bedtime.       Marland Kitchen EPINEPHrine (EPI-PEN) 0.3 mg/0.3 mL DEVI Inject 0.3 mLs (0.3 mg total) into the muscle once as needed (Use as directed for allergy reaction.).  1 Device  3  . NIFEdipine (PROCARDIA) 10 MG capsule Take 1 capsule (10 mg total) by mouth every 6 (six) hours as needed.  90 capsule  1  . Prenatal Vit-Fe Fumarate-FA (PRENATAL MULTIVITAMIN) TABS tablet Take 1 tablet by mouth at bedtime.       . progesterone (PROMETRIUM) 100 MG capsule 1 caplet vaginally at hs  30 capsule  0    ROS: Pertinent findings in history of present illness.  Physical Exam  Blood pressure 124/81, pulse 105, temperature 99.2 F (37.3 C), temperature source Oral, resp. rate 16, height 5\' 5"  (1.651 m), weight 70.852 kg (156 lb 3.2 oz), last menstrual period 04/29/2012, SpO2 100.00%. GENERAL: Well-developed, well-nourished female in no acute distress.  HEENT: normocephalic HEART: normal rate RESP: normal effort ABDOMEN: Soft, non-tender, gravid appropriate for gestational age EXTREMITIES: Nontender, no edema NEURO: alert and oriented  Dilation: 4 Effacement (%):  100 Station: -2 Presentation: Vertex Exam by:: Clayton Lefort, CNM Bloody show with exam  FHT:  Baseline 135, moderate variability, accelerations present, no decelerations Contractions: q 5 mins with irritability in between, moderate to palpation   Labs: Results for orders placed during the hospital encounter of 01/03/13 (from the past 24 hour(s))  URINALYSIS, ROUTINE W REFLEX MICROSCOPIC     Status: None   Collection Time    01/03/13 12:45 PM      Result Value Range   Color, Urine YELLOW  YELLOW   APPearance CLEAR  CLEAR   Specific Gravity, Urine 1.015  1.005 - 1.030   pH 7.0  5.0 - 8.0   Glucose, UA NEGATIVE  NEGATIVE mg/dL   Hgb urine dipstick NEGATIVE  NEGATIVE   Bilirubin Urine NEGATIVE  NEGATIVE   Ketones, ur NEGATIVE  NEGATIVE mg/dL   Protein, ur NEGATIVE  NEGATIVE mg/dL   Urobilinogen, UA  0.2  0.0 - 1.0 mg/dL   Nitrite NEGATIVE  NEGATIVE   Leukocytes, UA NEGATIVE  NEGATIVE    Assessment: 1. Preterm labor, fetus 1     Plan: Consult Dr Henderson Cloud Admit to [facility] GBS rapid screen collected Start GBS prophylaxis r/t preterm status.  Pt PCN allergic, no sensitivity results.  Vancomycin 1g Q12 hours per CDC guidelines. May have epidural as desired     Medication List    ASK your doctor about these medications       acetaminophen 500 MG tablet  Commonly known as:  TYLENOL  Take 1,000 mg by mouth every 6 (six) hours as needed for mild pain.     EPINEPHrine 0.3 mg/0.3 mL Soaj injection  Commonly known as:  EPI-PEN  Inject 0.3 mLs (0.3 mg total) into the muscle once as needed (Use as directed for allergy reaction.).     NIFEdipine 10 MG capsule  Commonly known as:  PROCARDIA  Take 1 capsule (10 mg total) by mouth every 6 (six) hours as needed.     prenatal multivitamin Tabs tablet  Take 1 tablet by mouth at bedtime.     progesterone 100 MG capsule  Commonly known as:  PROMETRIUM  1 caplet vaginally at hs     STOOL SOFTENER PO  Take 2 tablets by mouth at bedtime.     VITAMIN B 12 PO  Take 1 tablet by mouth at bedtime.        Sharen Counter Certified Nurse-Midwife 01/03/2013 1:54 PM

## 2013-01-03 NOTE — Anesthesia Procedure Notes (Signed)
Epidural Patient location during procedure: OB Start time: 01/03/2013 3:50 PM  Staffing Performed by: anesthesiologist   Preanesthetic Checklist Completed: patient identified, site marked, surgical consent, pre-op evaluation, timeout performed, IV checked, risks and benefits discussed and monitors and equipment checked  Epidural Patient position: sitting Prep: site prepped and draped and DuraPrep Patient monitoring: continuous pulse ox and blood pressure Approach: midline Injection technique: LOR air  Needle:  Needle type: Tuohy  Needle gauge: 17 G Needle length: 9 cm and 9 Needle insertion depth: 5 cm cm Catheter type: closed end flexible Catheter size: 19 Gauge Catheter at skin depth: 10 cm Test dose: negative  Assessment Events: blood not aspirated, injection not painful, no injection resistance, negative IV test and no paresthesia  Additional Notes Discussed risk of headache, infection, bleeding, nerve injury and failed or incomplete block.  Patient voices understanding and wishes to proceed.  Epidural placed easily on first attempt.  No paresthesia.  Patient tolerated procedure well with no apparent complications.  Jasmine December, MDReason for block:procedure for pain

## 2013-01-03 NOTE — Transfer of Care (Signed)
Immediate Anesthesia Transfer of Care Note  Patient: Lisa Berg  Procedure(s) Performed: Procedure(s): CESAREAN SECTION (N/A)  Patient Location: PACU  Anesthesia Type:Epidural  Level of Consciousness: awake, alert  and oriented  Airway & Oxygen Therapy: Patient Spontanous Breathing  Post-op Assessment: Report given to PACU RN and Post -op Vital signs reviewed and stable  Post vital signs: Reviewed and stable  Complications: No apparent anesthesia complications

## 2013-01-03 NOTE — MAU Note (Signed)
Patient states she is having contractions every 5 minutes with mucus discharge. Denies bleeding or watery leaking. Reports good fetal movement.

## 2013-01-03 NOTE — Lactation Note (Signed)
This note was copied from the chart of Lisa Berg. Lactation Consultation Note  Patient Name: Lisa Berg [id number] Date: 01/03/2013 Reason for consult: Follow-up assessment;Late preterm infant;Infant < 6lbs Called to assist Mom with hand expression for low one touch. Hand expressed approx 9 ml of EBM and spoon fed to baby. Advised Mom that we should start her pumping due to baby sleepy and not interested in BF. Discussed with RN to set up DEBP and have Mom pump on preemie setting to encourage milk production and give the baby back any amount of EBM received. Encouraged Mom to BF with feeding ques, at least every 3 hours. Post pump for 15 minutes on preemie setting. Discussed late preterm behaviors with Mom. Lactation brochure left for review. Advised of OP services and support group. Advised to ask for assist with feedings.   Maternal Data Formula Feeding for Exclusion: No Infant to breast within first hour of birth: No Breastfeeding delayed due to:: Maternal status Has patient been taught Hand Expression?: Yes Does the patient have breastfeeding experience prior to this delivery?: No  Feeding Feeding Type: Breast Milk with Formula added  LATCH Score/Interventions       Type of Nipple: Everted at rest and after stimulation  Comfort (Breast/Nipple): Soft / non-tender           Lactation Tools Discussed/Used     Consult Status Consult Status: Follow-up Date: 01/04/13 Follow-up type: In-patient    Alfred Levins 01/03/2013, 9:18 PM

## 2013-01-03 NOTE — Anesthesia Postprocedure Evaluation (Signed)
  Anesthesia Post-op Note  Patient: Lisa Berg  Procedure(s) Performed: Procedure(s) (LRB): CESAREAN SECTION (N/A)  Patient Location: PACU  Anesthesia Type: Epidural  Level of Consciousness: awake and alert   Airway and Oxygen Therapy: Patient Spontanous Breathing  Post-op Pain: mild  Post-op Assessment: Post-op Vital signs reviewed, Patient's Cardiovascular Status Stable, Respiratory Function Stable, Patent Airway and No signs of Nausea or vomiting  Last Vitals:  Filed Vitals:   01/03/13 1900  BP: 106/60  Pulse: 84  Temp:   Resp: 17    Post-op Vital Signs: stable   Complications: No apparent anesthesia complications

## 2013-01-03 NOTE — Anesthesia Preprocedure Evaluation (Addendum)
Anesthesia Evaluation  Patient identified by MRN, date of birth, ID band Patient awake    Reviewed: Allergy & Precautions, H&P , NPO status , Patient's Chart, lab work & pertinent test results, reviewed documented beta blocker date and time   History of Anesthesia Complications Negative for: history of anesthetic complications  Airway Mallampati: II TM Distance: >3 FB Neck ROM: full    Dental  (+) Teeth Intact   Pulmonary neg pulmonary ROS,  breath sounds clear to auscultation        Cardiovascular negative cardio ROS  Rhythm:regular Rate:Normal     Neuro/Psych  Headaches, negative psych ROS   GI/Hepatic negative GI ROS, Neg liver ROS,   Endo/Other  negative endocrine ROS  Renal/GU negative Renal ROS  negative genitourinary   Musculoskeletal   Abdominal   Peds  Hematology negative hematology ROS (+)   Anesthesia Other Findings   Reproductive/Obstetrics (+) Pregnancy (STAT C/S fpr fetal distress)                          Anesthesia Physical Anesthesia Plan  ASA: II and emergent  Anesthesia Plan: Epidural   Post-op Pain Management:    Induction:   Airway Management Planned:   Additional Equipment:   Intra-op Plan:   Post-operative Plan:   Informed Consent: I have reviewed the patients History and Physical, chart, labs and discussed the procedure including the risks, benefits and alternatives for the proposed anesthesia with the patient or authorized representative who has indicated his/her understanding and acceptance.     Plan Discussed with: Surgeon and CRNA  Anesthesia Plan Comments:        Anesthesia Quick Evaluation

## 2013-01-03 NOTE — H&P (Signed)
Lisa Berg is a 27 y.o. female presenting for UCs. No ROM. Pregnancy complicated by preterm cervical change. S/P betamethasone. Maternal Medical History:  Reason for admission: Contractions.   Contractions: Onset was 3-5 hours ago.    Fetal activity: Perceived fetal activity is normal.      OB History   Grav Para Term Preterm Abortions TAB SAB Ect Mult Living   1         0     Past Medical History  Diagnosis Date  . Migraine   . Allergy    Past Surgical History  Procedure Laterality Date  . Wisdom tooth extraction     Family History: family history includes Cancer in her mother; Depression in her mother; Diabetes in her maternal grandfather; Heart disease in her father and maternal grandfather; Hyperlipidemia in her father, mother, and sister; Hypertension in her father; Miscarriages / Stillbirths in her maternal grandmother; Stroke in her maternal aunt. There is no history of Asthma, Birth defects, COPD, Drug abuse, Hearing loss, Kidney disease, or Learning disabilities. Social History:  reports that she has never smoked. She does not have any smokeless tobacco history on file. She reports that she does not drink alcohol or use illicit drugs.   Prenatal Transfer Tool  Maternal Diabetes: No Genetic Screening: Normal Maternal Ultrasounds/Referrals: Abnormal:  Findings:   Other:short cervix Fetal Ultrasounds or other Referrals:  None Maternal Substance Abuse:  No Significant Maternal Medications:  None Significant Maternal Lab Results:  None Other Comments:  None  Review of Systems  Eyes: Negative for blurred vision.  Gastrointestinal: Negative for abdominal pain.  Neurological: Negative for headaches.    Dilation: 6 Effacement (%): 100 Station: -1;0 Exam by:: Dr Henderson Cloud Blood pressure 124/90, pulse 98, temperature 99 F (37.2 C), temperature source Oral, resp. rate 20, height 5\' 1"  (1.549 m), weight 156 lb (70.761 kg), last menstrual period 04/29/2012,  SpO2 100.00%. Maternal Exam:  Uterine Assessment: Contraction strength is firm.  Contraction frequency is regular.   Abdomen: Fetal presentation: vertex     Fetal Exam Fetal State Assessment: Category I - tracings are normal.     Physical Exam  Cardiovascular: Normal rate and regular rhythm.   Respiratory: Effort normal and breath sounds normal.  GI: Soft. There is no tenderness.  Neurological: She has normal reflexes.    Prenatal labs: ABO, Rh: A/Positive/-- (06/18 0000) Antibody: Negative (06/18 0000) Rubella: Immune (06/18 0000) RPR: Nonreactive (06/18 0000)  HBsAg: Negative (06/18 0000)  HIV: Non-reactive (06/18 0000)  GBS:     Assessment/Plan: 27 yo G1P0 at 35 5/7 weeks in labor Epidural prn Peds for delivery GBBS swab done Vancomycin ordered   Lisa Berg,Lisa Berg 01/03/2013, 3:27 PM

## 2013-01-03 NOTE — Lactation Note (Signed)
This note was copied from the chart of Lisa Berg. Lactation Consultation Note  Patient Name: Lisa Archer Vise [id number] Date: 01/03/2013 Reason for consult: Initial assessment;Infant < 6lbs;Late preterm infant Called to PACU to see Mom and Baby. Baby late preterm with low one touch. Baby not latching, sleepy. When I arrived, RN was giving some formula for low blood sugar, she had tried hand expression but not received but few drops. Mom was agreeable for LC to hand express. LC was able to obtain approx 10 ml of colostrum with hand expression. RN spoon fed this to the baby. Some basic teaching reviewed with Mom. Will follow up with Mom later tonight and give brochure.   Maternal Data Formula Feeding for Exclusion: No Infant to breast within first hour of birth: No Breastfeeding delayed due to:: Maternal status Has patient been taught Hand Expression?: Yes Does the patient have breastfeeding experience prior to this delivery?: No  Feeding Feeding Type: Breast Milk with Formula added  LATCH Score/Interventions       Type of Nipple: Everted at rest and after stimulation  Comfort (Breast/Nipple): Soft / non-tender           Lactation Tools Discussed/Used     Consult Status Consult Status: Follow-up Date: 01/04/13 Follow-up type: In-patient    Alfred Levins 01/03/2013, 8:39 PM

## 2013-01-03 NOTE — Brief Op Note (Signed)
01/03/2013  6:15 PM  PATIENT:  Lisa Berg  27 y.o. female  PRE-OPERATIVE DIAGNOSIS:  Nonreassuring Fetal Heartrate  POST-OPERATIVE DIAGNOSIS:  Nonreassuring Fetal Heartrate  PROCEDURE:  Procedure(s): CESAREAN SECTION (N/A)  SURGEON:  Surgeon(s) and Role:    * Leslie Andrea, MD - Primary  PHYSICIAN ASSISTANT:   ASSISTANTS: none   ANESTHESIA:   epidural  EBL:  Total I/O In: 600 [I.V.:600] Out: 500 [Urine:100; Blood:400]  BLOOD ADMINISTERED:none  DRAINS: Urinary Catheter (Foley)   LOCAL MEDICATIONS USED:  OTHER Exaprel  SPECIMEN:  Source of Specimen:  Placenta  DISPOSITION OF SPECIMEN:  PATHOLOGY  COUNTS:  YES  TOURNIQUET:  * No tourniquets in log *  DICTATION: .Other Dictation: Dictation Number [id number]  PLAN OF CARE: Admit to inpatient   PATIENT DISPOSITION:  PACU - hemodynamically stable.   Delay start of Pharmacological VTE agent (>24hrs) due to surgical blood loss or risk of bleeding: not applicable

## 2013-01-04 LAB — CBC
MCH: 30.9 pg (ref 26.0–34.0)
MCHC: 34.4 g/dL (ref 30.0–36.0)
MCV: 89.8 fL (ref 78.0–100.0)
Platelets: 166 10*3/uL (ref 150–400)
RBC: 3.43 MIL/uL — ABNORMAL LOW (ref 3.87–5.11)
RDW: 13.6 % (ref 11.5–15.5)

## 2013-01-04 LAB — GLUCOSE, CAPILLARY: Glucose-Capillary: 115 mg/dL — ABNORMAL HIGH (ref 70–99)

## 2013-01-04 MED ORDER — HYDROCORTISONE 1 % EX CREA
TOPICAL_CREAM | Freq: Three times a day (TID) | CUTANEOUS | Status: DC | PRN
  Administered 2013-01-05 (×3): via TOPICAL
  Filled 2013-01-04: qty 28

## 2013-01-04 NOTE — Anesthesia Postprocedure Evaluation (Signed)
Anesthesia Post Note  Patient: Lisa Berg  Procedure(s) Performed: Procedure(s) (LRB): CESAREAN SECTION (N/A)  Anesthesia type: Epidural  Patient location: Mother/Baby  Post pain: Pain level controlled  Post assessment: Post-op Vital signs reviewed  Last Vitals:  Filed Vitals:   01/04/13 0644  BP: 105/57  Pulse: 99  Temp:   Resp: 20    Post vital signs: Reviewed  Level of consciousness:alert  Complications: No apparent anesthesia complications

## 2013-01-04 NOTE — Lactation Note (Signed)
This note was copied from the chart of Lisa Berg. Lactation Consultation Note Initial consultation, baby now 17 hours old, mom states baby has not latched for a feeding. At this time, baby is in CN for circumcision.  Discussed at length with mom the implications of LPT infant. Suggested that mom begin pumping now, mom agrees.  DEP initiated and mom instructed how to use it and frequency of use.  At that time, baby was returned to mom's room. Offered to assist with a feeding, mom accepts.  Baby very sleepy at the breast and did not latch.  Plan to follow up with this mother again today.  Patient Name: Lisa Berg [id number] Date: 01/04/2013 Reason for consult: Initial assessment   Maternal Data Formula Feeding for Exclusion: No Has patient been taught Hand Expression?: Yes Does the patient have breastfeeding experience prior to this delivery?: No  Feeding    LATCH Score/Interventions Latch: Too sleepy or reluctant, no latch achieved, no sucking elicited.  Audible Swallowing: None  Type of Nipple: Everted at rest and after stimulation  Comfort (Breast/Nipple): Soft / non-tender     Hold (Positioning): Assistance needed to correctly position infant at breast and maintain latch.  LATCH Score: 5  Lactation Tools Discussed/Used Pump Review: Setup, frequency, and cleaning;Milk Storage Initiated by:: BS Date initiated:: 01/04/13   Consult Status Consult Status: Follow-up Follow-up type: In-patient    Octavio Manns [facility] 01/04/2013, 1:18 PM

## 2013-01-04 NOTE — Progress Notes (Signed)
Patient with rash outlining previous tape on right arm and right thigh Also rash over upper back and over right shoulder-itchy  VSS Afeb Erythematous rash with small raised bumps as described above Husband states rash on back is slightly better now than earlier today  A: Probably contact rash from tape  P: Benadryl prn      Hydrocortisone cream prn

## 2013-01-04 NOTE — Progress Notes (Signed)
Subjective: Postpartum Day 1: Cesarean Delivery Patient reports tolerating PO and + flatus.    Objective: Vital signs in last 24 hours: Temp:  [97.3 F (36.3 C)-99.2 F (37.3 C)] 97.9 F (36.6 C) (12/27 0600) Pulse Rate:  [59-108] 99 (12/27 0644) Resp:  [15-20] 20 (12/27 0644) BP: (90-132)/(41-91) 105/57 mmHg (12/27 0644) SpO2:  [96 %-100 %] 98 % (12/27 0644) Weight:  [156 lb (70.761 kg)-156 lb 3.2 oz (70.852 kg)] 156 lb (70.761 kg) (12/26 1426)  Physical Exam:  General: alert, cooperative and no distress Lochia: appropriate Uterine Fundus: firm Incision: healing well DVT Evaluation: No evidence of DVT seen on physical exam.   Recent Labs  01/03/13 1505 01/04/13 0558  HGB 13.6 10.5*  HCT 39.3 30.8*    Assessment/Plan: Status post Cesarean section. Doing well postoperatively.  Continue current care.  Zack Crager II,Noa Constante E 01/04/2013, 8:32 AM

## 2013-01-04 NOTE — Progress Notes (Signed)
RN in and pt c/o burry vision, no h/a's, occasional  Seeing spots, no shaking noted. CBG random done to r/o low  CBG. Results 115. Pt stated she had this 2 days ago and drank a soda and felt better. Infant under obs nursery care for with low CBG'S. VS stable.? Related to scope patch, dilaudid given post-op, toradol, and other anesthesia meds given. Pt reassurance and family given.Refles normal , no clonus.

## 2013-01-04 NOTE — Op Note (Signed)
NAME:  Lisa Berg, Lisa Berg   ACCOUNT NO.:  [id number]  MEDICAL RECORD NO.:  [id number]  LOCATION:  9101                          FACILITY:  [facility]  PHYSICIAN:  Guy Sandifer. Henderson Cloud, M.D. DATE OF BIRTH:  June 13, 1985  DATE OF PROCEDURE:  01/03/2013 DATE OF DISCHARGE:                              OPERATIVE REPORT   PREOPERATIVE DIAGNOSIS:  Non-reassuring fetal heart tracing.  POSTOPERATIVE DIAGNOSIS:  Non-reassuring fetal heart tracing.  PROCEDURE:  Emergent low transverse cesarean section.  SURGEON:  Guy Sandifer. Henderson Cloud, MD  ANESTHESIA:  Epidural, Dana Allan, MD  ESTIMATED BLOOD LOSS:  800 mL.  SPECIMENS:  Placenta to Pathology.  FINDINGS:  Viable female infant.  Apgars, arterial cord pH, and weight pending.  INDICATIONS AND CONSENT:  This patient is a 27 year old G1, P0 at 67- 5/7th weeks, who presents to the hospital in labor.  She progresses to 6- cm dilation, complete effacement, -1 station.  At her request, she obtained an epidural.  There was a deceleration during epidural placement which was resuscitated.  Baby then had a reactive fetal heart tracing.  There were some variable decelerations with contractions. Approximately 2 hours later, the cervix was unchanged at 6 cm. Artificial rupture of membranes for clear fluid was carried out.  Baby then had decelerations and the heartbeat was below 100 and sometimes in the 60s to 80s for approximately 5 or 6 minutes.  She was resuscitated with position change, Trendelenburg position, IV fluid bolus, oxygen was administered.  Fetal heart rate did not respond to this.  Cervix was 8 cm, but the vertex remained above 0.  Recommendation from urgent cesarean section was made, and the patient was transported to the operating room.  DESCRIPTION OF PROCEDURE:  Upon entrance to the operative suite, the patient had been dosed in transport by Dr. Rodman Pickle.  She was placed in dorsal supine position with a 15-degree left lateral wedge.   Foley catheter was already in place.  Spot check fetal heart rate was in the 60s to 80s.  The abdomen was then rapidly prepped with Betadine and was draped in a sterile fashion.  After testing for adequate epidural anesthesia, skin was entered through a Pfannenstiel incision. Dissection was carried out in layers to the peritoneum.  Peritoneum was incised and bluntly extended.  Vesicouterine peritoneum was taken down cephalad laterally.  The bladder flap was developed.  Uterus was incised in a low transverse manner and the uterine cavity was entered bluntly with a hemostat.  Uterine incision was extended cephalad laterally with fingers.  A loop of cord immediately presented through the incision which was wrapped around the site of the baby's shoulder and head. Vertex was delivered.  Baby was delivered.  Good cry and tone was noted. Cord was clamped and cut, and the baby was handed to the awaiting Pediatrics Team.  Placenta was manually delivered and sent to Pathology. Uterine cavity was clean.  Uterus was closed in two running locking imbricating layers of 0 Monocryl suture which achieved good hemostasis. Anterior peritoneum was closed in a running fashion with 0 Monocryl suture which was also used to reapproximate the pyramidalis muscle in the midline.  Anterior rectus fascia was closed in a running fashion with 0 looped PDS suture and the  subcutaneous tissues were reapproximated with interrupted plain.  A 20 mL of Exparel diluted in 40 mL of saline was then injected into the subfascial and subcutaneous space.  Approximately 2/3rd of this solution was used.  Skin was then reapproximated in a running fashion with a Vicryl suture on a Keith needle.  Steri-Strips dressings were applied.  All counts were correct. The patient was taken to the recovery room in stable condition.     Guy Sandifer Henderson Cloud, M.D.     JET/MEDQ  D:  01/03/2013  T:  01/04/2013  Job:  [id number]

## 2013-01-04 NOTE — Lactation Note (Addendum)
This note was copied from the chart of Lisa Berg. Lactation Consultation Note Mom states that baby has not latched today. Baby now 22 hours and s/p circumcision. Mom has attempted and baby has shown some feeding cues, but did not maintain a latch. Mom has pumped and hand expressed.  Mom states that they have been using a cup to feed baby; states that with RN assistance, they fed about 5 mL expressed colostrum using a cup. Mom and dad state that they like using the cup as long as baby doesn't latch very well, as an alternative feeding method. After pumping, assisted mom to latch baby on  Right side using nipple shield. Baby latched well with rhythmic sucking and occasional audible swallowing. Drops of colostrum present in nipple shield when baby finished.  Feeding plan for tonight:  Attempt latch. If no latch, cup feed pumped breast milk. Pump and hand express 8 times a day (once at night). Written instructions provided.  Enc mom to call if she has any concerns.  Patient Name: Lisa Berg Date: 01/04/2013     Maternal Data    Feeding Feeding Type: Breast Milk  LATCH Score/Interventions                      Lactation Tools Discussed/Used     Consult Status      Lisa Berg 01/04/2013, 3:41 PM

## 2013-01-05 NOTE — Progress Notes (Signed)
Subjective: Postpartum Day 2: Cesarean Delivery Patient reports tolerating PO, + flatus and no problems voiding.  Itching and rash better   Objective: Vital signs in last 24 hours: Temp:  [97.8 F (36.6 C)-98.8 F (37.1 C)] 98.3 F (36.8 C) (12/28 0545) Pulse Rate:  [67-91] 79 (12/28 0545) Resp:  [14-18] 18 (12/28 0545) BP: (98-108)/(48-73) 101/67 mmHg (12/28 0545) SpO2:  [96 %-98 %] 97 % (12/28 0545)  Physical Exam:  General: alert, cooperative and no distress Lochia: appropriate Uterine Fundus: firm Incision: healing well DVT Evaluation: No evidence of DVT seen on physical exam.   Recent Labs  01/03/13 1505 01/04/13 0558  HGB 13.6 10.5*  HCT 39.3 30.8*    Assessment/Plan: Status post Cesarean section. Doing well postoperatively.  Continue current care.  Kynzley Dowson II,Jaques Mineer E 01/05/2013, 7:52 AM

## 2013-01-06 ENCOUNTER — Encounter (HOSPITAL_COMMUNITY): Payer: Self-pay | Admitting: Obstetrics and Gynecology

## 2013-01-06 MED ORDER — OXYCODONE-ACETAMINOPHEN 5-325 MG PO TABS: 1.0000 | ORAL_TABLET | ORAL | Status: DC | PRN

## 2013-01-06 MED ORDER — IBUPROFEN 600 MG PO TABS: 600.0000 mg | ORAL_TABLET | Freq: Four times a day (QID) | ORAL | Status: DC

## 2013-01-06 NOTE — Lactation Note (Signed)
This note was copied from the chart of Boy Kadeja Granada. Lactation Consultation Note Follow up consult:  Baby boy 52 hours old and [redacted]w[redacted]D is becoming baby patient. 10% weight loss.  Mother's breasts are filling. Assisted mother with cross cradle position using #20 nipple shield.  Mother pumping after feeding and giving it to the baby with a syringe.  New plan going forward is to prepump when possible 3-5 minutes before latching baby so he will get hind milk for weight gain. Breastfeed using nipple shield for 15-20 minutes each breast if possible.  Then dad is to give pumped breastmilk according to volume guidelines. Recommended using slow flow nipple. Mother is to post pump for 15 minutes after feedings during the day.  Breastfeed only at night.  Monitor voids & stools.  Refer to baby & me booklet page 24 for chart.  Outpatient appointment Monday 01/13/13 2:30 pm.  Reviewed waking techniques, engorgement care, breastmilk storage, late preterm infants and lactation support services.  Encouraged mother to call for further asssitance and questions.  Lactation to follow up.   Patient Name: Boy Byrdie Miyazaki IONGE'X Date: 01/06/2013 Reason for consult: Follow-up assessment   Maternal Data    Feeding Feeding Type: Breast Fed  LATCH Score/Interventions Latch: Grasps breast easily, tongue down, lips flanged, rhythmical sucking. (using #20 nipple shield) Intervention(s): Skin to skin;Waking techniques Intervention(s): Assist with latch  Audible Swallowing: A few with stimulation Intervention(s): Hand expression Intervention(s): Skin to skin  Type of Nipple: Everted at rest and after stimulation  Comfort (Breast/Nipple): Filling, red/small blisters or bruises, mild/mod discomfort  Problem noted: Filling  Hold (Positioning): Assistance needed to correctly position infant at breast and maintain latch. Intervention(s): Support Pillows;Position options  LATCH Score:  7  Lactation Tools Discussed/Used Tools: Nipple Dorris Carnes;Pump Nipple shield size: 20 Breast pump type: Double-Electric Breast Pump   Consult Status Consult Status: Follow-up Date: 01/06/13 Follow-up type: In-patient    [facility]ia [facility] 01/06/2013, 10:09 AM

## 2013-01-06 NOTE — Discharge Summary (Signed)
Obstetric Discharge Summary Reason for Admission: onset of labor Prenatal Procedures: ultrasound Intrapartum Procedures: cesarean: low cervical, transverse Postpartum Procedures: none Complications-Operative and Postpartum: none Hemoglobin  Date Value Range Status  01/04/2013 10.5* 12.0 - 15.0 g/dL Final     REPEATED TO VERIFY     DELTA CHECK NOTED     HCT  Date Value Range Status  01/04/2013 30.8* 36.0 - 46.0 % Final    Physical Exam:  General: alert and cooperative Lochia: appropriate Uterine Fundus: firm Incision: healing well DVT Evaluation: No evidence of DVT seen on physical exam.  Discharge Diagnoses: Term Pregnancy-delivered  Discharge Information: Date: 01/06/2013 Activity: pelvic rest Diet: routine Medications: PNV, Ibuprofen and Percocet Condition: stable Instructions: refer to practice specific booklet Discharge to: home Follow-up Information   Schedule an appointment as soon as possible for a visit in 2 weeks to follow up.      Newborn Data: Live born female  Birth Weight: 5 lb 9.8 oz (2545 g) APGAR: 9, 9  Home with mother.  Lisa Berg 01/06/2013, 8:53 AM

## 2013-01-07 ENCOUNTER — Ambulatory Visit: Payer: Self-pay

## 2013-01-07 NOTE — Lactation Note (Signed)
This note was copied from the chart of Lisa Berg. Lactation Consultation Note  Patient Name: Lisa Berg OZHYQ'M Date: 01/07/2013 Reason for consult: Follow-up assessment;Infant < 6lbs;Late preterm infant Mom is engorged. Assisted Mom with positioning to obtain more depth with latch. Baby demonstrates a good rhythmic suck with swallows audible. Baby nursed for 6 minutes on left breast, rechecked weight, baby was 5 lb. 1.3 oz which was a .9 increase from the 2300 weight last night.  Decided to do pre-post weight before nursing on right breast. Baby BF for 10 minutes and transferred per weight 30 ml of EBM. Instructed Mom to post pump to comfort, then apply ice packs for engorgement. Engorgement plan given to and reviewed with Mom. Mom is latching baby now without the nipple shield. Advised Mom to pre-pump to soften aerola before nursing, thru 1st milk ejection so baby will get the higher fat content breast milk. Keep baby actively nursing for 15-20 minutes. Keep OP follow scheduled for next week.   Maternal Data    Feeding Feeding Type: Breast Fed Length of feed: 10 min  LATCH Score/Interventions Latch: Grasps breast easily, tongue down, lips flanged, rhythmical sucking. Intervention(s): Adjust position;Assist with latch;Breast massage;Breast compression  Audible Swallowing: Spontaneous and intermittent  Type of Nipple: Everted at rest and after stimulation  Comfort (Breast/Nipple): Engorged, cracked, bleeding, large blisters, severe discomfort Problem noted: Engorgment Intervention(s): Hand expression;Ice     Hold (Positioning): Assistance needed to correctly position infant at breast and maintain latch. Intervention(s): Breastfeeding basics reviewed;Support Pillows;Position options;Skin to skin  LATCH Score: 7  Lactation Tools Discussed/Used Tools: Pump Breast pump type: Double-Electric Breast Pump   Consult Status Consult Status:  Complete Date: 01/07/13 Follow-up type: In-patient    Alfred Levins 01/07/2013, 9:45 AM

## 2013-01-13 ENCOUNTER — Ambulatory Visit (HOSPITAL_COMMUNITY): Payer: No Typology Code available for payment source

## 2013-01-15 ENCOUNTER — Ambulatory Visit: Payer: No Typology Code available for payment source | Admitting: Family Medicine

## 2013-04-09 ENCOUNTER — Encounter: Payer: Self-pay | Admitting: Nurse Practitioner

## 2013-04-09 ENCOUNTER — Encounter: Payer: Self-pay | Admitting: Family Medicine

## 2013-04-09 ENCOUNTER — Ambulatory Visit (INDEPENDENT_AMBULATORY_CARE_PROVIDER_SITE_OTHER): Payer: No Typology Code available for payment source | Admitting: Family Medicine

## 2013-04-09 VITALS — BP 110/74 | HR 87 | Temp 98.1°F | Resp 16

## 2013-04-09 DIAGNOSIS — K921 Melena: Secondary | ICD-10-CM | POA: Insufficient documentation

## 2013-04-09 NOTE — Patient Instructions (Signed)
Follow up as needed Please complete the stool studies as directed We'll call you with your GI appt PLEASE call if symptoms continue, change, or worsen! Hang in there!!

## 2013-04-09 NOTE — Progress Notes (Signed)
Pre visit review using our clinic review tool, if applicable. No additional management support is needed unless otherwise documented below in the visit note. 

## 2013-04-09 NOTE — Progress Notes (Signed)
   Subjective:    Patient ID: Lisa Berg, female    DOB: 09/16/1985, 28 y.o.   MRN: [id number]  HPI Bloody stool- sxs started last night but there was no stool.  Pt reports bleeding all night but it was not in underwear or on toilet tissue- just in toilet bowl.  Not currently having menstrual cycle.  Had BM this AM and had blood again.  + hemorrhoids.  This doesn't feel similar.  No pain.  No cramping.  No dysuria, frequency.  + gas.  No hx of similar.  + nausea, no vomiting, no fevers.  Pt reports she is supposed to be starting her cycle.   Review of Systems For ROS see HPI     Objective:   Physical Exam  Vitals reviewed. Constitutional: She appears well-developed and well-nourished. No distress.  Abdominal: Soft. Bowel sounds are normal. She exhibits no distension. There is no tenderness. There is no rebound and no guarding.  Genitourinary: Rectal exam shows external hemorrhoid (very small). Rectal exam shows no fissure, no mass, no tenderness and anal tone normal. Guaiac negative stool. There is no rash, tenderness or lesion on the right labia. There is no rash, tenderness or lesion on the left labia. No erythema, tenderness or bleeding around the vagina. No foreign body around the vagina. No vaginal discharge found.          Assessment & Plan:

## 2013-04-09 NOTE — Assessment & Plan Note (Signed)
New.  Pt brought bag of bloody anal mucous to office today.  Initially, there was bloody mucous w/ passing gas but then it occurred w/ BM this AM.  Heme negative in office today.  No obvious bleeding hemorrhoids or lesions.  Pelvic exam was unremarkable.  Check stool studies and refer to GI for evaluation.  Will follow.

## 2013-04-14 ENCOUNTER — Ambulatory Visit (INDEPENDENT_AMBULATORY_CARE_PROVIDER_SITE_OTHER): Payer: No Typology Code available for payment source | Admitting: Nurse Practitioner

## 2013-04-14 ENCOUNTER — Encounter: Payer: Self-pay | Admitting: Nurse Practitioner

## 2013-04-14 VITALS — BP 98/64 | HR 60 | Ht 60.0 in | Wt 120.8 lb

## 2013-04-14 DIAGNOSIS — K625 Hemorrhage of anus and rectum: Secondary | ICD-10-CM | POA: Insufficient documentation

## 2013-04-14 DIAGNOSIS — K648 Other hemorrhoids: Secondary | ICD-10-CM | POA: Insufficient documentation

## 2013-04-14 LAB — STOOL CULTURE

## 2013-04-14 MED ORDER — HYDROCORTISONE ACETATE 25 MG RE SUPP: 25.0000 mg | Freq: Every day | RECTAL | Status: DC

## 2013-04-14 NOTE — Progress Notes (Signed)
Reviewed and agree with management plan.  Danuel Felicetti T. Dajha Urquilla, MD FACG 

## 2013-04-14 NOTE — Progress Notes (Signed)
HPI :  Patient is a 28 year old female here for evaluation of rectal bleeding. Patient is 3 months postpartum, C-section. Last Wednesday, 5 days ago, patient began passing some mucus and blood with her bowel movements and when expelling gas. Stools themselves are normal including frequency and consistency. She does have occasional constipation, takes stool softeners. No abdominal pain, no rectal pain. The bleeding actually stopped 2 days ago. Patient does give a history of hemorrhoids, she occasionally uses over-the-counter hemorrhoidal preparations. No other complaints. Her weight is stable except for that which she is losing post childbirth. No family history of colon cancer or inflammatory bowel disease as far as the patient knows.  Past Medical History  Diagnosis Date  . Migraine   . Allergy     Family History  Problem Relation Age of Onset  . Cancer Mother   . Depression Mother   . Hyperlipidemia Mother   . Hyperlipidemia Father   . Heart disease Father   . Hypertension Father   . Hyperlipidemia Sister   . Stroke Maternal Aunt   . Miscarriages / Stillbirths Maternal Grandmother   . Diabetes Maternal Grandfather   . Heart disease Maternal Grandfather   . Asthma Neg Hx   . Birth defects Neg Hx   . COPD Neg Hx   . Drug abuse Neg Hx   . Hearing loss Neg Hx   . Kidney disease Neg Hx   . Learning disabilities Neg Hx    History  Substance Use Topics  . Smoking status: Never Smoker   . Smokeless tobacco: Never Used  . Alcohol Use: No   Current Outpatient Prescriptions  Medication Sig Dispense Refill  . Cyanocobalamin (VITAMIN B 12 PO) Take 1 tablet by mouth at bedtime.       Tery Sanfilippo. Docusate Calcium (STOOL SOFTENER PO) Take 2 tablets by mouth at bedtime.       Marland Kitchen. EPINEPHrine (EPI-PEN) 0.3 mg/0.3 mL DEVI Inject 0.3 mLs (0.3 mg total) into the muscle once as needed (Use as directed for allergy reaction.).  1 Device  3  . Prenatal Vit-Fe Fumarate-FA (PRENATAL MULTIVITAMIN) TABS  tablet Take 1 tablet by mouth at bedtime.       . hydrocortisone (ANUSOL-HC) 25 MG suppository Place 1 suppository (25 mg total) rectally at bedtime.  10 suppository  0   No current facility-administered medications for this visit.   Allergies  Allergen Reactions  . Bee Venom Swelling  . Latex Rash  . Orange Concentrate [Flavoring Agent] Rash  . Penicillins Rash   Review of Systems: All systems reviewed and negative except where noted in HPI.   Physical Exam: BP 98/64  Pulse 60  Ht 5' (1.524 m)  Wt 120 lb 12.8 oz (54.795 kg)  BMI 23.59 kg/m2  Breastfeeding? Yes Constitutional: Pleasant,well-developed, white female in no acute distress. HEENT: Normocephalic and atraumatic. Conjunctivae are normal. No scleral icterus. Neck supple.  Cardiovascular: Normal rate, regular rhythm.  Pulmonary/chest: Effort normal and breath sounds normal. No wheezing, rales or rhonchi. Abdominal: Soft, nondistended, nontender. Bowel sounds active throughout. There are no masses palpable. No hepatomegaly. Rectal: no external lesions. On anoscopy there were some mildly inflamed internal hemorrhoids Extremities: no edema Lymphadenopathy: No cervical adenopathy noted. Neurological: Alert and oriented to person place and time. Skin: Skin is warm and dry. No rashes noted. Psychiatric: Normal mood and affect. Behavior is normal.   ASSESSMENT AND PLAN:  [birth year]. 28 year old female with transient,  painless rectal bleeding/mucoid discharge last week. Bleeding started last  Wednesday, resolved two days ago. No associated pain, bowel changes or unexpected weight loss. Small internal hemorrhoids on anoscopy. Suspect the transient bleeding was secondary to internal hemorrhoids which I will treat with steroid suppositories. I did discuss the importance of patient calling back should she have any recurrent bleeding, any bowel changes, any abdominal / rectal pain, or other gastrointestinal problems.  2. Internal  hemorrhoids, see #1.  3. Three months post-partum

## 2013-04-14 NOTE — Patient Instructions (Signed)
If you have bowel changes, abdominal pain or more rectal bleeding please call the office back. [facility] [place].  Please continue your stool softner.  Please avoid straining and constipation. Information on constipation is below for your review.  We have sent the following medications to your pharmacy for you to pick up at your convenience: Anusol Suppositories, please insert one suppository rectally at bedtime for seven days ______________________________________________________________________________________________________________________________________________________________________________________________________  Constipation, Adult Constipation is when a person has fewer than 3 bowel movements a week; has difficulty having a bowel movement; or has stools that are dry, hard, or larger than normal. As people grow older, constipation is more common. If you try to fix constipation with medicines that make you have a bowel movement (laxatives), the problem may get worse. Long-term laxative use may cause the muscles of the colon to become weak. A low-fiber diet, not taking in enough fluids, and taking certain medicines may make constipation worse. CAUSES   Certain medicines, such as antidepressants, pain medicine, iron supplements, antacids, and water pills.   Certain diseases, such as diabetes, irritable bowel syndrome (IBS), thyroid disease, or depression.   Not drinking enough water.   Not eating enough fiber-rich foods.   Stress or travel.  Lack of physical activity or exercise.  Not going to the restroom when there is the urge to have a bowel movement.  Ignoring the urge to have a bowel movement.  Using laxatives too much. SYMPTOMS   Having fewer than 3 bowel movements a week.   Straining to have a bowel movement.   Having hard, dry, or larger than normal stools.   Feeling full or bloated.   Pain in the lower abdomen.  Not feeling  relief after having a bowel movement. DIAGNOSIS  Your caregiver will take a medical history and perform a physical exam. Further testing may be done for severe constipation. Some tests may include:   A barium enema X-ray to examine your rectum, colon, and sometimes, your small intestine.  A sigmoidoscopy to examine your lower colon.  A colonoscopy to examine your entire colon. TREATMENT  Treatment will depend on the severity of your constipation and what is causing it. Some dietary treatments include drinking more fluids and eating more fiber-rich foods. Lifestyle treatments may include regular exercise. If these diet and lifestyle recommendations do not help, your caregiver may recommend taking over-the-counter laxative medicines to help you have bowel movements. Prescription medicines may be prescribed if over-the-counter medicines do not work.  HOME CARE INSTRUCTIONS   Increase dietary fiber in your diet, such as fruits, vegetables, whole grains, and beans. Limit high-fat and processed sugars in your diet, such as [place] fries, hamburgers, cookies, candies, and soda.   A fiber supplement may be added to your diet if you cannot get enough fiber from foods.   Drink enough fluids to keep your urine clear or pale yellow.   Exercise regularly or as directed by your caregiver.   Go to the restroom when you have the urge to go. Do not hold it.  Only take medicines as directed by your caregiver. Do not take other medicines for constipation without talking to your caregiver first. SEEK IMMEDIATE MEDICAL CARE IF:   You have bright red blood in your stool.   Your constipation lasts for more than 4 days or gets worse.   You have abdominal or rectal pain.   You have thin, pencil-like stools.  You have unexplained weight loss. MAKE SURE YOU:  Understand these instructions.  Will watch your condition.  Will get help right away if you are not doing well or get worse. Document  Released: 09/24/2003 Document Revised: 03/20/2011 Document Reviewed: 10/07/2012 [facility] Patient Information 2014 [place], [place].

## 2013-11-10 ENCOUNTER — Encounter: Payer: Self-pay | Admitting: Nurse Practitioner

## 2013-11-21 ENCOUNTER — Ambulatory Visit (INDEPENDENT_AMBULATORY_CARE_PROVIDER_SITE_OTHER): Payer: Self-pay | Admitting: Medical

## 2013-11-21 ENCOUNTER — Encounter: Payer: Self-pay | Admitting: Medical

## 2013-11-21 VITALS — BP 105/76 | HR 115 | Temp 98.6°F | Ht 60.2 in | Wt 108.0 lb

## 2013-11-21 DIAGNOSIS — N61 Inflammatory disorders of breast: Secondary | ICD-10-CM

## 2013-11-21 DIAGNOSIS — M791 Myalgia: Secondary | ICD-10-CM

## 2013-11-21 DIAGNOSIS — IMO0001 Reserved for inherently not codable concepts without codable children: Secondary | ICD-10-CM

## 2013-11-21 DIAGNOSIS — M609 Myositis, unspecified: Secondary | ICD-10-CM

## 2013-11-21 LAB — POCT INFLUENZA A/B
Influenza A, POC: NEGATIVE
Influenza B, POC: NEGATIVE

## 2013-11-21 MED ORDER — FLUCONAZOLE 150 MG PO TABS: 150.0000 mg | ORAL_TABLET | Freq: Once | ORAL | Status: DC

## 2013-11-21 MED ORDER — CLINDAMYCIN HCL 300 MG PO CAPS: 300.0000 mg | ORAL_CAPSULE | Freq: Three times a day (TID) | ORAL | Status: DC

## 2013-11-21 NOTE — Progress Notes (Signed)
Subjective:    Patient ID: Lisa Berg, female    DOB: 12/15/1985, 28 y.o. Lisa Berg  MRN: [id number]  HPI   Pt in states she thinks she has infection in her left breast. Pt stopped breast feeding at end of October. But she continues to pump. Pt son is 3510 months old. Mom was going breast feed for a year but will stop earlier if needed.Pt states breast is sore. Fever of 100.4 this morning. Pt left breast been sore for 2 days. Small lump 2 days afer she stopped breast feeding.   Some diffuse muscle soreness with fever and left breast pain.  LMP- currenlty.  Pt took no medication today for fever.  Mom-39 when dx with breast cancer. Mom was treated.  Past Medical History  Diagnosis Date  . Migraine   . Allergy     History   Social History  . Marital Status: Married    Spouse Name: N/A    Number of Children: 1  . Years of Education: N/A   Occupational History  . Not on file.   Social History Main Topics  . Smoking status: Never Smoker   . Smokeless tobacco: Never Used  . Alcohol Use: No  . Drug Use: No  . Sexual Activity: Yes   Other Topics Concern  . Not on file   Social History Narrative    Past Surgical History  Procedure Laterality Date  . Wisdom tooth extraction    . Cesarean section N/A 01/03/2013    Procedure: CESAREAN SECTION;  Surgeon: Leslie AndreaJames E Tomblin II, MD;  Location: [facility];  Service: Obstetrics;  Laterality: N/A;    Family History  Problem Relation Age of Onset  . Cancer Mother   . Depression Mother   . Hyperlipidemia Mother   . Hyperlipidemia Father   . Heart disease Father   . Hypertension Father   . Hyperlipidemia Sister   . Stroke Maternal Aunt   . Miscarriages / Stillbirths Maternal Grandmother   . Diabetes Maternal Grandfather   . Heart disease Maternal Grandfather   . Asthma Neg Hx   . Birth defects Neg Hx   . COPD Neg Hx   . Drug abuse Neg Hx   . Hearing loss Neg Hx   . Kidney disease Neg Hx   . Learning disabilities Neg Hx      Allergies  Allergen Reactions  . Bee Venom Swelling  . Latex Rash  . Orange Concentrate [Flavoring Agent] Rash  . Penicillins Rash    Current Outpatient Prescriptions on File Prior to Visit  Medication Sig Dispense Refill  . Cyanocobalamin (VITAMIN B 12 PO) Take 1 tablet by mouth at bedtime.     Tery Sanfilippo. Docusate Calcium (STOOL SOFTENER PO) Take 2 tablets by mouth at bedtime.     Marland Kitchen. EPINEPHrine (EPI-PEN) 0.3 mg/0.3 mL DEVI Inject 0.3 mLs (0.3 mg total) into the muscle once as needed (Use as directed for allergy reaction.). 1 Device 3  . Prenatal Vit-Fe Fumarate-FA (PRENATAL MULTIVITAMIN) TABS tablet Take 1 tablet by mouth at bedtime.     . hydrocortisone (ANUSOL-HC) 25 MG suppository Place 1 suppository (25 mg total) rectally at bedtime. 10 suppository 0   No current facility-administered medications on file prior to visit.    BP 105/76 mmHg  Pulse 115  Temp(Src) 98.6 F (37 C) (Oral)  Ht 5' 0.2" (1.529 m)  Wt 108 lb (48.988 kg)  BMI 20.95 kg/m2  SpO2 100%  LMP 11/19/2013  Breastfeeding? No  Review of Systems  Constitutional: Positive for fever. Negative for chills and fatigue.  HENT: Positive for congestion and sore throat.   Respiratory: Negative for cough, chest tightness, shortness of breath and wheezing.   Cardiovascular: Negative for chest pain and palpitations.  Gastrointestinal: Negative for nausea, vomiting, abdominal pain, diarrhea and constipation.  Genitourinary: Negative for dysuria and flank pain.  Skin:       Skin of left breast faint red and warm. Nipple also red.  Hematological: Negative for adenopathy. Does not bruise/bleed easily.       Objective:   Physical Exam  Constitutional: She appears well-developed and well-nourished. No distress.  Eyes: Conjunctivae and EOM are normal. Pupils are equal, round, and reactive to light.  Neck: Normal range of motion. Neck supple. No JVD present. No tracheal deviation present. No thyromegaly present.   Cardiovascular: Normal rate, regular rhythm and normal heart sounds.   Pulmonary/Chest: Effort normal and breath sounds normal. No stridor. No respiratory distress. She has no wheezes. She has no rales. She exhibits no tenderness.  Lymphadenopathy:    She has no cervical adenopathy.  Skin: She is not diaphoretic.  Breast area symmetric in size. Lt breast medial to nipple minimal pinkish red streak. Area is not warm to touch but it is tender. Her left nipple is brighter red than the rt nipple.  No masses or lumps felt on the breast.  Psychiatric: She has a normal mood and affect. Her behavior is normal. Judgment and thought content normal.   Axillary area- no lymph nodes in the axillary areas.          Assessment & Plan:

## 2013-11-21 NOTE — Progress Notes (Signed)
Pre visit review using our clinic review tool, if applicable. No additional management support is needed unless otherwise documented below in the visit note. 

## 2013-11-21 NOTE — Assessment & Plan Note (Signed)
Pt had fever and myalgias. LPN did flu test based on these initial complaints. Flu test was negative.

## 2013-11-21 NOTE — Patient Instructions (Addendum)
You appear to have mastitis of left breast. I am prescribing clindamycin antibiotic.(choice of antibiotics limited due to pcn allergy). You could do warm compresses twice daily. I do recommend stopping pumping and breast feeding.  Du to your mom hx of breast CA, I want to follow up in 10 days but sooner if your symptoms are not improving or if worsening.  You could use probiotics as well over next week.  I am prescribing diflucan if you get yeast infection  Mom is going to stop breast feeding.She has a lot of milk in bottles already pumped. Will feed her child already pumped milk.

## 2013-11-21 NOTE — Assessment & Plan Note (Signed)
Pt appears  to have mastitis of left breast. I am prescribing clindamycin antibiotic.(choice of antibiotics limited due to pcn allergy). Pt could do warm compresses twice daily. I do recommend stopping pumping and breast feeding.  Due to your mom hx of breast CA, I want to follow up in 10 days but sooner if your symptoms are not improving or if worsening.

## 2013-11-27 ENCOUNTER — Ambulatory Visit (INDEPENDENT_AMBULATORY_CARE_PROVIDER_SITE_OTHER): Payer: No Typology Code available for payment source | Admitting: Family Medicine

## 2013-11-27 ENCOUNTER — Encounter: Payer: Self-pay | Admitting: Family Medicine

## 2013-11-27 VITALS — BP 110/70 | HR 87 | Temp 98.1°F | Resp 16 | Wt 113.2 lb

## 2013-11-27 DIAGNOSIS — N61 Inflammatory disorders of breast: Secondary | ICD-10-CM

## 2013-11-27 MED ORDER — TOPIRAMATE 200 MG PO TABS: 200.0000 mg | ORAL_TABLET | Freq: Every day | ORAL | Status: DC

## 2013-11-27 NOTE — Patient Instructions (Signed)
Follow up as needed The mastitis is resolving (Great news!)- finish the Clindamycin as directed Massage the breasts while in the shower to help relieve the pressure Alternate hot/cold compresses- whichever feels better Use a hand pump ([facility] [employer]) or just your hands to manually express the milk- not to the point that the breasts are empty, but enough to relieve pain and pressure Continue to keep breasts tightly pressed with sports bras or wraps Call with any questions or concerns Hang in there! Happy Thanksgiving and Early Birthday!

## 2013-11-27 NOTE — Progress Notes (Signed)
Pre visit review using our clinic review tool, if applicable. No additional management support is needed unless otherwise documented below in the visit note. 

## 2013-11-27 NOTE — Progress Notes (Signed)
   Subjective:    Patient ID: Lisa Berg, female    DOB: 04/03/1985, 28 y.o.   MRN: [id number]  HPI Mastitis- seen on 11/13 and dx'd w/ mastitis.  Started on Clindamycin.  Has 4 days remaining.  No fevers.  L breast is still sore but pt is attempting to wean and now both breasts are sore.  Was previously binding and doing cabbage leaves but stopped everything 2 days ago.   Review of Systems For ROS see HPI     Objective:   Physical Exam  Constitutional: She appears well-developed and well-nourished. No distress.  Pulmonary/Chest: Right breast exhibits tenderness. Right breast exhibits no inverted nipple and no skin change. Left breast exhibits tenderness. Left breast exhibits no inverted nipple and no skin change.  Breasts are engorged bilaterally w/o evidence of current mastitis- no warmth, erythema  Vitals reviewed.         Assessment & Plan:

## 2013-11-30 NOTE — Assessment & Plan Note (Signed)
Mastitis is resolving but pt now engorged to stopping breastfeeding abruptly when she was previously feeding every 3-4 hrs.  Reviewed need to gradually dry up supply and recommended gentle expression of milk while in hot shower, using hand to manually express just enough to relieve discomfort, or using hand pump in 'on' position and manually expressing milk w/ gentle pressure to relieve engorgement.  Pt expressed understanding and is in agreement w/ plan.

## 2013-12-01 ENCOUNTER — Telehealth: Payer: Self-pay | Admitting: Family Medicine

## 2013-12-01 NOTE — Telephone Encounter (Signed)
Ok to stop Clindamycin- please add to Allergy list If pt takes another dose of Topamax and develops hives, will need to STOP topamax

## 2013-12-01 NOTE — Telephone Encounter (Signed)
Spoke with pt. She is going to wait three days (until after thanksgiving) and then try the topamax again. She wants to make sure everything is out of her system. Allergy list updated.

## 2013-12-01 NOTE — Telephone Encounter (Signed)
Spoke with husband, who was at work and wife was at home, and he states that patient started taking Topamax on 11/28/13.  Later that day, after taking medication, pt began to breakout in hives.  She has self treated with benadryl.  He states that the hives have improved this morning.  He's concerned that the Topamax may have interacted with the clindamycin that patient was taking for mastitis.    Spoke with patient and she thinks the reaction was due to the clindamycin and not the Topamax.  Pt states she has taken Topamax before and did not have a reaction.  However, she has never taken clindamycin and is allergic to penicillin.  She says that her mother is also allergic to penicillin and when she was prescribed Clindamycin, she also had a reaction.   Pt states she has stopped taking the Clindamycin (she has 1 pill left) and not the Topamax.  She asked her husband to call to make Dr. Beverely Lowabori aware of the reaction.  She shared that she is taking Benadryl 50 mg every 4 hours.  She was also encouraged to bathe in Aveeno Oatmeal bath and to apply hydrocortisone cream to the rash to relieve itching. She was also encourage to avoid hot baths as this may cause the itching to worsen.  Pt states understanding and agreed.

## 2013-12-01 NOTE — Telephone Encounter (Signed)
Please triage pt

## 2013-12-01 NOTE — Telephone Encounter (Signed)
Caller name: He,Brian Relation to pt: spouse  Call back number: [phone] Pharmacy:  Reason for call:   Spouse states pt broke out into rash due to topiramate (TOPAMAX) 200 MG tablet  that was prescribed at last OV 11/27/13. Pt stop taking medication in need of clinical advice

## 2014-03-10 LAB — HM PAP SMEAR: HM Pap smear: NORMAL

## 2014-05-05 ENCOUNTER — Other Ambulatory Visit: Payer: Self-pay | Admitting: Family Medicine

## 2014-05-06 NOTE — Telephone Encounter (Signed)
Med denied, pt needs an appt.  

## 2014-05-25 ENCOUNTER — Other Ambulatory Visit: Payer: Self-pay | Admitting: Family Medicine

## 2014-05-25 NOTE — Telephone Encounter (Signed)
Last OV 11-27-13 (mastitis), pt has never had CPE with provider topamax last filled 11-27-13 #30 with 3

## 2014-05-25 NOTE — Telephone Encounter (Signed)
Med filled.  

## 2014-05-25 NOTE — Telephone Encounter (Signed)
Will refill but pt needs to schedule CPE- no refills if appt not scheduled

## 2014-05-25 NOTE — Telephone Encounter (Signed)
Letter sent to pt to schedule a CPE.

## 2014-05-29 IMAGING — US US OB COMP LESS 14 WK
1 series · 14 of 19 positions shown · non-contrast
Comparison: None.

CLINICAL DATA: Bleeding with early pregnancy. Estimated gestational
age by LMP is 7 weeks 1 day.  Quantitative beta HCG is pending.
Latex allergy.

OBSTETRIC <14 WK ULTRASOUND
TECHNIQUE: Transabdominal ultrasound was performed for evaluation
of the gestation as well as the maternal uterus and adnexal
regions.

[Series 1: us ob comp less 14 wks · 19 acquisitions, 14 frames shown]
[im 1/19]
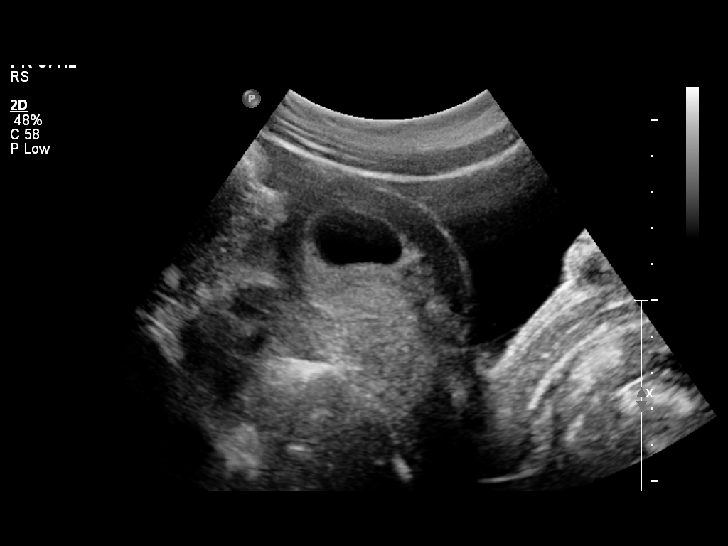
[im 3/19]
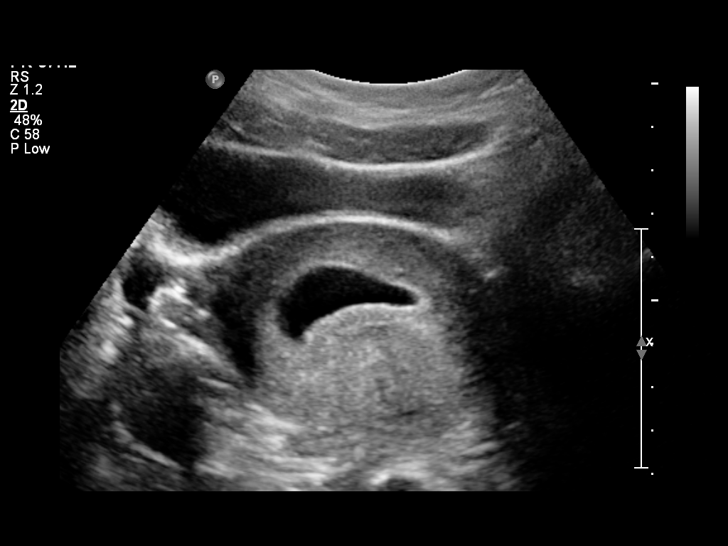
[im 4/19]
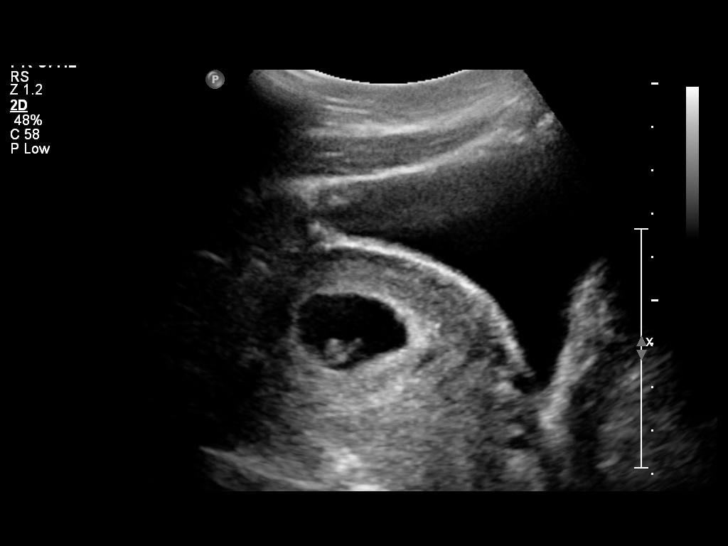
[im 5/19]
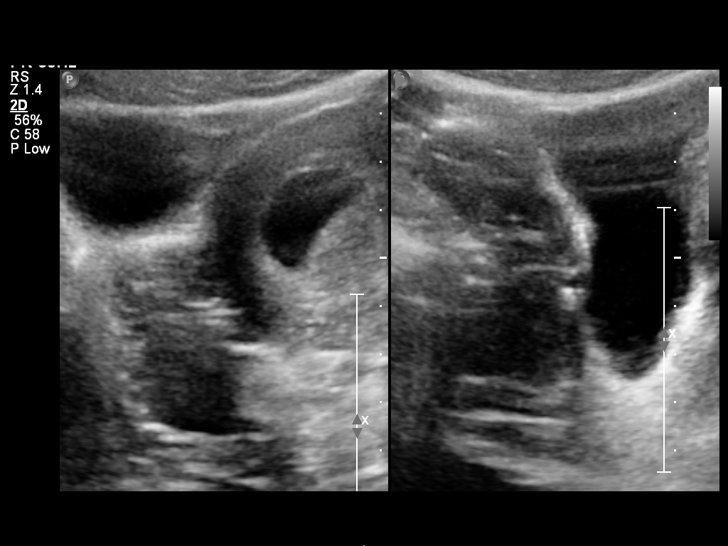
[im 7/19]
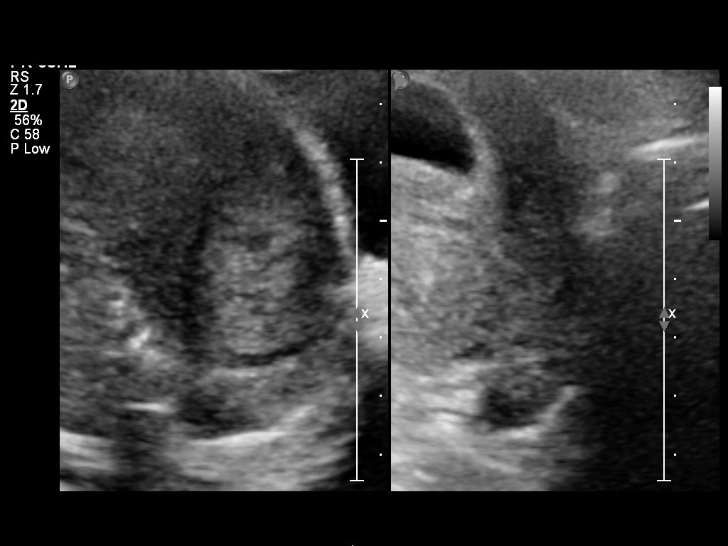
[im 8/19]
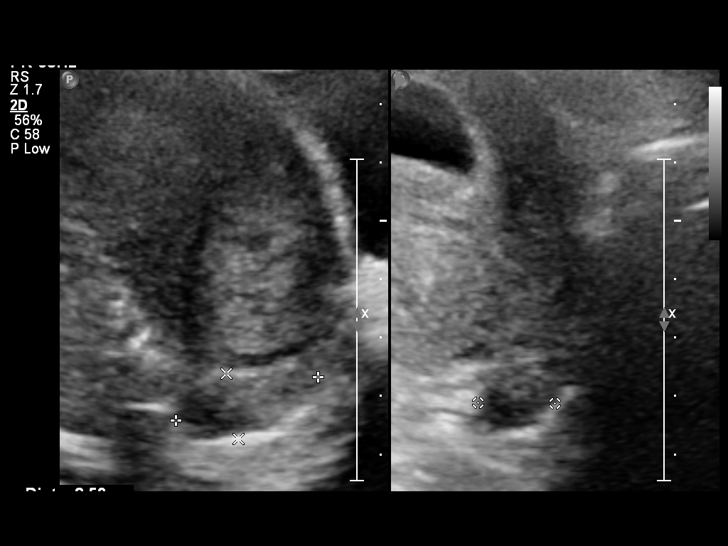
[im 9/19]
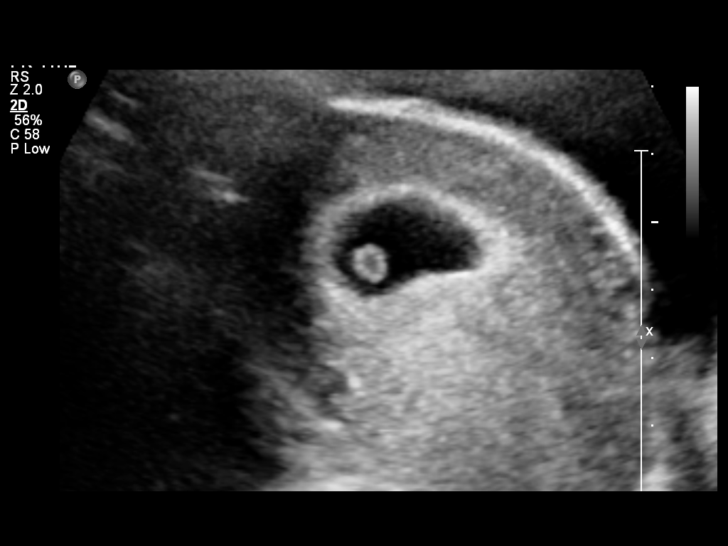
[im 11/19]
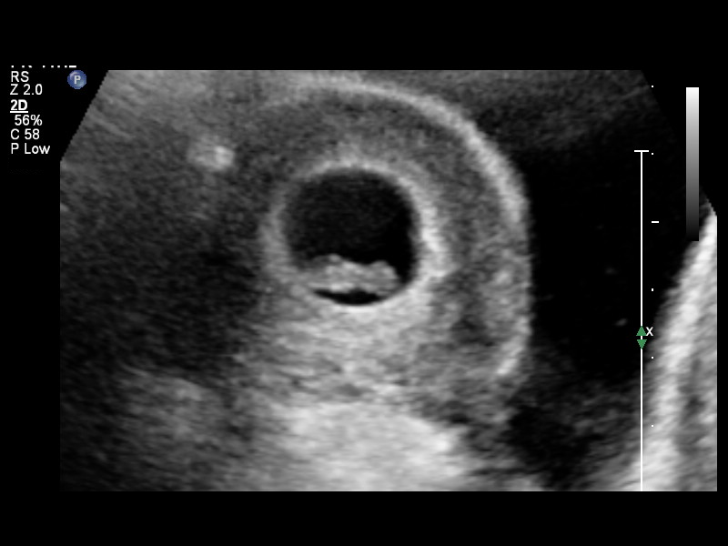
[im 12/19]
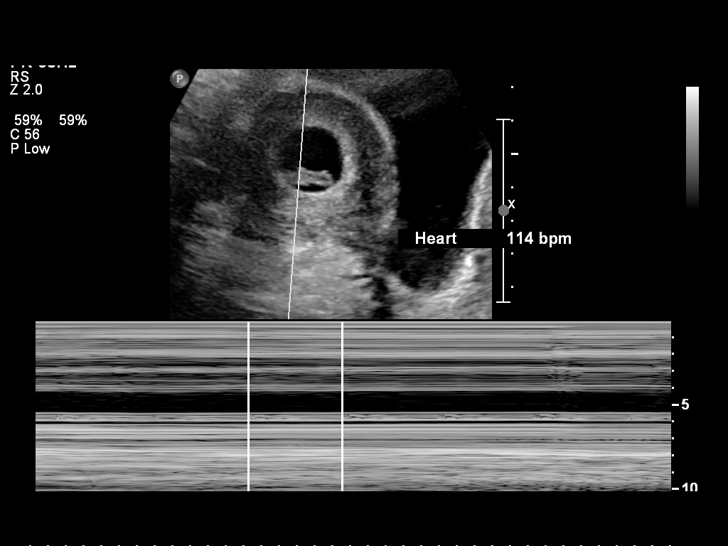
[im 13/19]
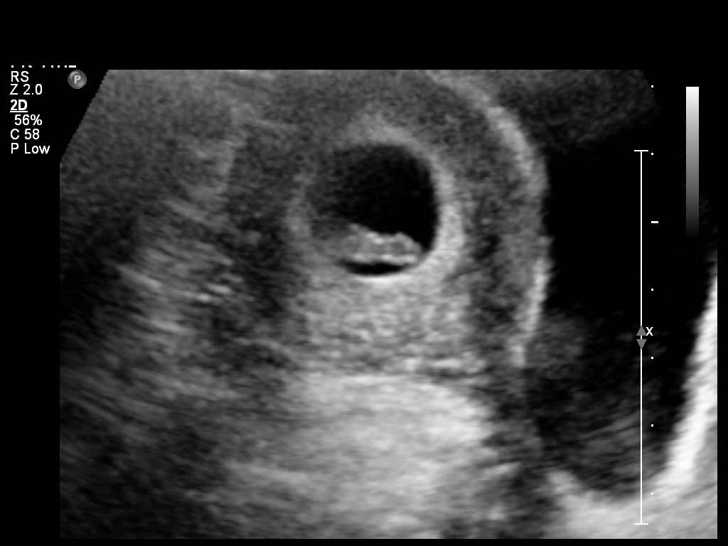
[im 15/19]
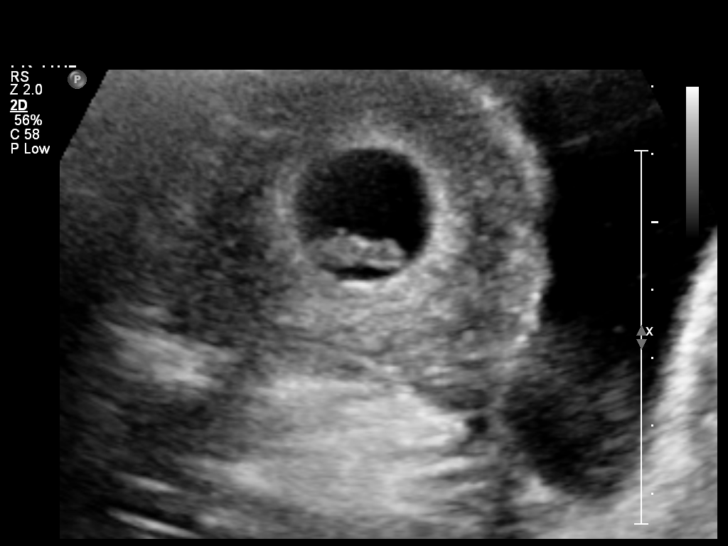
[im 16/19]
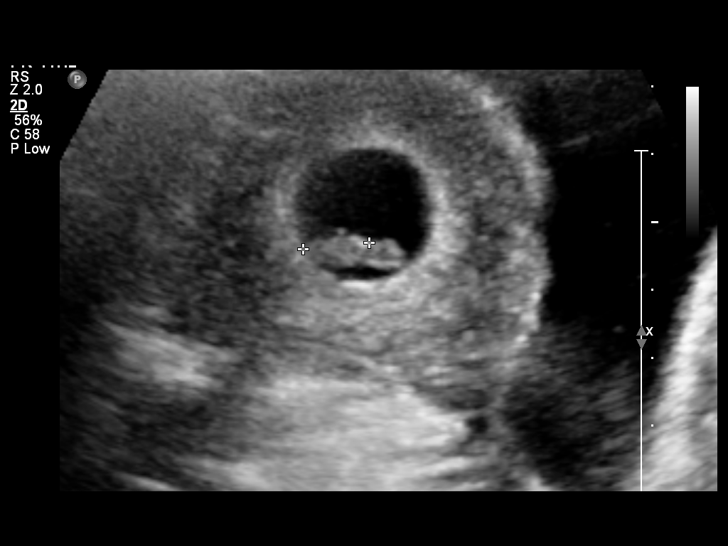
[im 17/19]
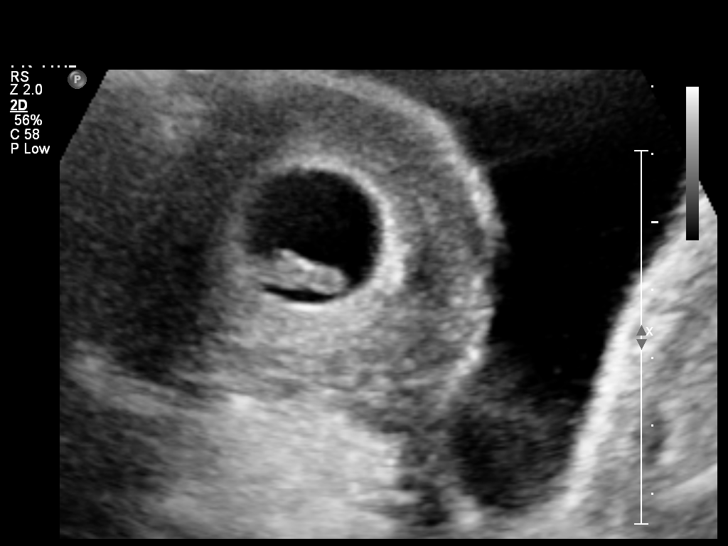
[im 19/19]
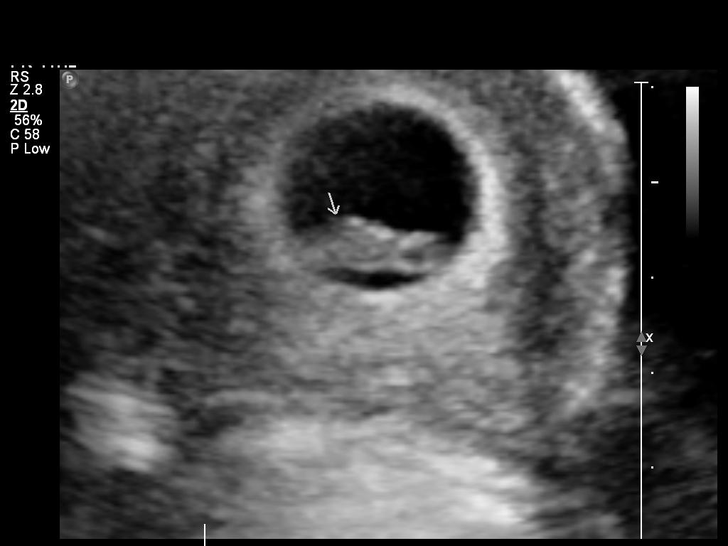

[14 of 19 positions shown; findings below may reference images not displayed]

Intrauterine gestational sac: Visualized/normal in shape.
Yolk sac: Yolk sac is identified.
Embryo: Fetal pole is identified.
Cardiac Activity: Fetal cardiac activity is identified.
Heart Rate: 114 bpm

CRL:  9.4 mm  7 w  0 d        US EDC: 02/03/2013

Maternal uterus/Adnexae:
No subchorionic hemorrhage identified.  No myometrial masses.  No
free pelvic fluid collections.  Both ovaries are visualized and
appear symmetrical.  No abnormal adnexal masses identified.
IMPRESSION: Single intrauterine pregnancy.  Estimated gestational age by crown-
rump length is 7-week 0 days.  This is consistent with reported
maternal dates.

## 2014-09-22 ENCOUNTER — Encounter: Payer: Self-pay | Admitting: Family Medicine

## 2014-09-22 ENCOUNTER — Ambulatory Visit (INDEPENDENT_AMBULATORY_CARE_PROVIDER_SITE_OTHER): Payer: Managed Care, Other (non HMO) | Admitting: Family Medicine

## 2014-09-22 VITALS — BP 122/80 | HR 76 | Temp 98.0°F | Resp 16 | Ht 60.0 in | Wt 117.1 lb

## 2014-09-22 DIAGNOSIS — G43009 Migraine without aura, not intractable, without status migrainosus: Secondary | ICD-10-CM

## 2014-09-22 DIAGNOSIS — F411 Generalized anxiety disorder: Secondary | ICD-10-CM | POA: Insufficient documentation

## 2014-09-22 DIAGNOSIS — Z91038 Other insect allergy status: Secondary | ICD-10-CM | POA: Diagnosis not present

## 2014-09-22 DIAGNOSIS — Z91030 Bee allergy status: Secondary | ICD-10-CM

## 2014-09-22 MED ORDER — EPINEPHRINE 0.3 MG/0.3ML IJ SOAJ: 0.3000 mg | Freq: Once | INTRAMUSCULAR | Status: DC | PRN

## 2014-09-22 MED ORDER — TOPIRAMATE 200 MG PO TABS: 200.0000 mg | ORAL_TABLET | Freq: Every day | ORAL | Status: DC

## 2014-09-22 MED ORDER — BUSPIRONE HCL 15 MG PO TABS: 15.0000 mg | ORAL_TABLET | Freq: Two times a day (BID) | ORAL | Status: DC

## 2014-09-22 NOTE — Assessment & Plan Note (Signed)
Refill provided on Epipen

## 2014-09-22 NOTE — Assessment & Plan Note (Signed)
Refill provided on Topamax.

## 2014-09-22 NOTE — Progress Notes (Signed)
   Subjective:    Patient ID: Lisa Berg, female    DOB: [birth year], 29 y.o.   MRN: [id number]  HPI Anxiety- pt was started on Zoloft by Dr Henderson Cloud but had side effects (insomnia, dry mouth).  On celexa currently but 'it's not working'- has had weight gain, insomnia, nightmares and pt remains tearful, anxious.  Last panic attack occurred ~1 month ago.  Denies depression.  Migraines- pt needs refill on Topamax.  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 She appears well-developed and well-nourished.  HENT:  Head: Normocephalic and atraumatic.  Neurological: She is alert and oriented to person, place, and time.  Skin: Skin is warm and dry.  Psychiatric: Her behavior is normal. Thought content normal.  Tearful, very anxious  Vitals reviewed.         Assessment & Plan:

## 2014-09-22 NOTE — Progress Notes (Signed)
Pre visit review using our clinic review tool, if applicable. No additional management support is needed unless otherwise documented below in the visit note. 

## 2014-09-22 NOTE — Assessment & Plan Note (Signed)
New.  Pt has been seeing GYN for tx.  Pt had side effects on Zoloft and Celexa and neither medication was effective in controlling her sxs.  GYN recommended psychiatric care and this upset pt and her husband.  Due to side effects on SSRI, will wean off the Celexa and start the Buspar twice daily.  Encouraged her to reframe her thinking and empower herself to have control over her emotions.  Also encouraged her to find a stress outlet.  If no improvement w/ medication, will need to start counseling to learn coping mechanisms and a possible future psych referral.  Pt expressed understanding and is in agreement w/ plan.  Total time spent w/ pt- 30 min, >50% spent counseling.

## 2014-09-22 NOTE — Patient Instructions (Signed)
Follow up in 3-4 weeks to recheck anxiety Decrease the Celexa to 1/2 tab x1 week and then stop Start the Bupar- 1/2 tab twice daily x1 week and then increase to 1 tab twice daily Try and find a stress outlet- music, art, exercise, etc- you deserve it! I refilled your topamax.  Topamax can decrease the effectiveness of birth control pills- just something to be aware of Call with any questions or concerns You can do this!!

## 2014-10-21 ENCOUNTER — Ambulatory Visit: Payer: Managed Care, Other (non HMO) | Admitting: Family Medicine

## 2014-10-22 ENCOUNTER — Encounter: Payer: Self-pay | Admitting: Family Medicine

## 2014-10-22 ENCOUNTER — Ambulatory Visit (INDEPENDENT_AMBULATORY_CARE_PROVIDER_SITE_OTHER): Payer: Managed Care, Other (non HMO) | Admitting: Family Medicine

## 2014-10-22 VITALS — BP 120/80 | HR 95 | Temp 98.2°F | Resp 16 | Ht 60.0 in | Wt 115.5 lb

## 2014-10-22 DIAGNOSIS — N910 Primary amenorrhea: Secondary | ICD-10-CM

## 2014-10-22 DIAGNOSIS — F411 Generalized anxiety disorder: Secondary | ICD-10-CM

## 2014-10-22 DIAGNOSIS — N926 Irregular menstruation, unspecified: Secondary | ICD-10-CM | POA: Insufficient documentation

## 2014-10-22 LAB — POCT URINE PREGNANCY: PREG TEST UR: NEGATIVE

## 2014-10-22 MED ORDER — BUSPIRONE HCL 30 MG PO TABS: 30.0000 mg | ORAL_TABLET | Freq: Two times a day (BID) | ORAL | Status: DC

## 2014-10-22 NOTE — Progress Notes (Signed)
Pre visit review using our clinic review tool, if applicable. No additional management support is needed unless otherwise documented below in the visit note. 

## 2014-10-22 NOTE — Progress Notes (Signed)
   Subjective:    Patient ID: Lisa Berg, female    DOB: 09/12/1985, 29 y.o.   MRN: [id number]  HPI Anxiety- ongoing issue for pt.  Started on Buspar at last visit.  Was doing very well until yesterday when she had a confrontation w/ a Radio broadcast assistantcoworker.  Pt is interested in increasing dose if possible.  Husband agrees that things are better at home.  Late period- period was due 2 weeks ago.  She admits to not taking OCPs regularly.  States she is under a lot of stress.   Review of Systems For ROS see HPI     Objective:   Physical Exam  Constitutional: She is oriented to person, place, and time. She appears well-developed and well-nourished. No distress.  HENT:  Head: Normocephalic and atraumatic.  Eyes: EOM are normal. Pupils are equal, round, and reactive to light.  Neurological: She is alert and oriented to person, place, and time.  Skin: Skin is warm and dry.  Psychiatric: She has a normal mood and affect. Her behavior is normal. Thought content normal.  Vitals reviewed.         Assessment & Plan:

## 2014-10-22 NOTE — Patient Instructions (Signed)
Schedule your complete physical at your convenience Increase the Buspar to 30mg  twice daily- new prescription was sent Continue to work on a stress outlet  Pregnancy test was negative- be sure to take your pills! Call with any questions or concerns Hang in there!!!

## 2014-10-22 NOTE — Assessment & Plan Note (Signed)
Improved since starting Buspar.  Pt and husband feel that her anxiety has improved and she is able to function better both at home and at work.  She feels that there is still room for improvement so we will increase Buspar to 30mg  BID.  Stressed need for her to find a stress outlet.  Will continue to follow.

## 2014-10-22 NOTE — Assessment & Plan Note (Signed)
New.  Stressed need for pt to take OCPs regularly.  Upreg in office was negative.

## 2014-11-18 IMAGING — US US OB TRANSVAGINAL
1 series · 10 of 10 positions shown · non-contrast
Comparison: none

[Series 1: us ob transvaginal · 10 acquisitions, 10 frames shown]
[im 1/10]
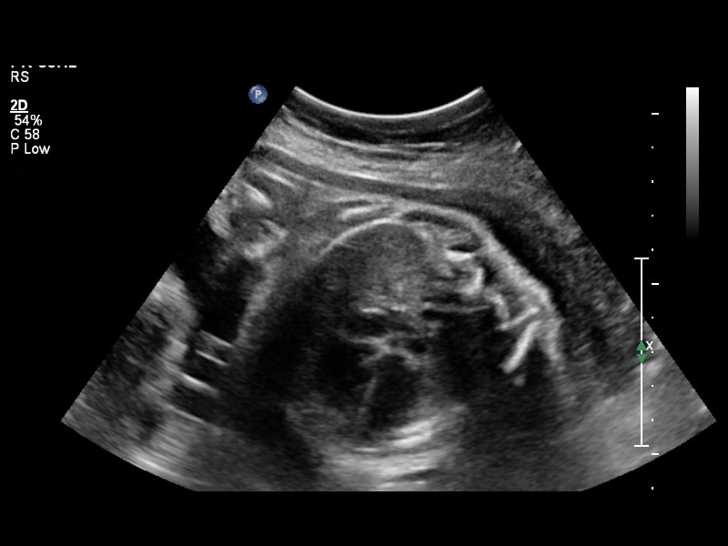
[im 2/10]
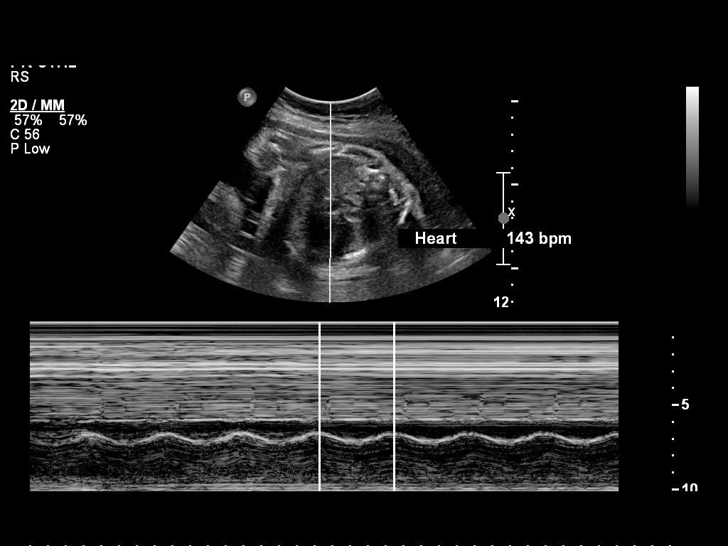
[im 3/10]
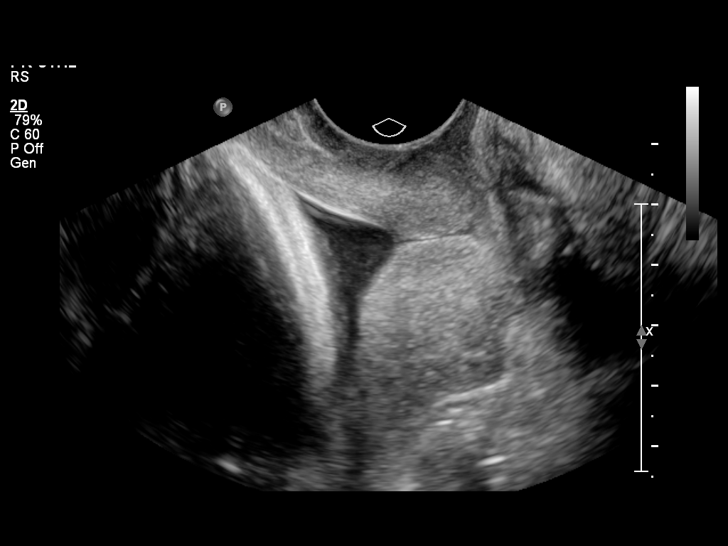
[im 4/10]
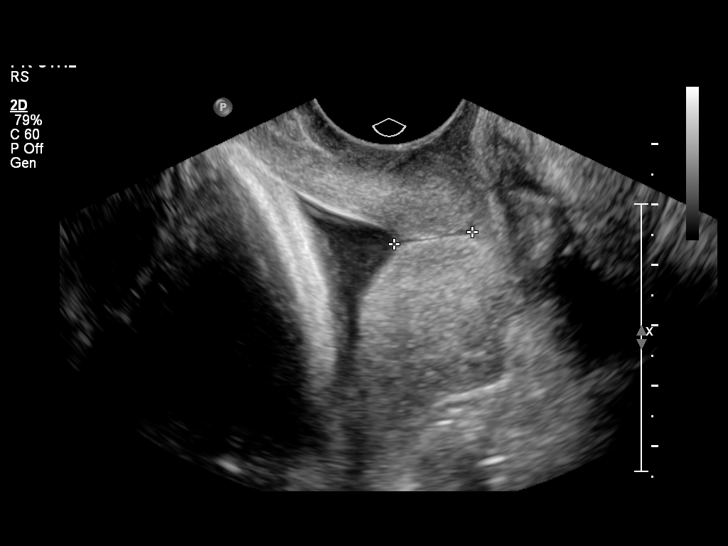
[im 5/10]
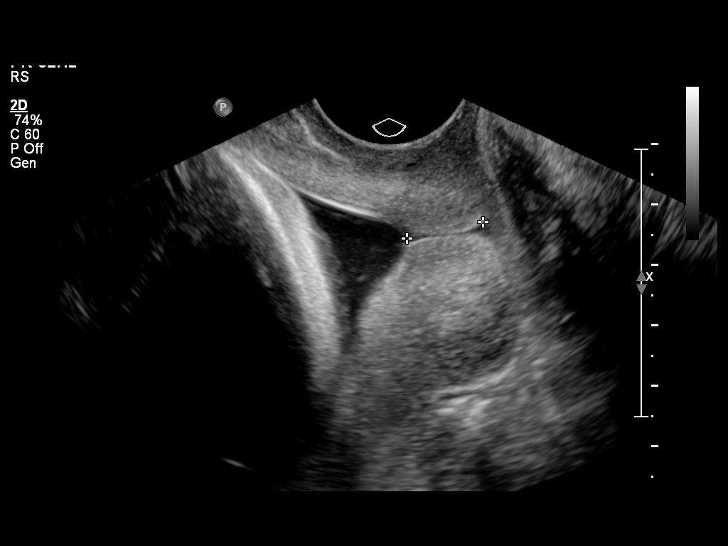
[im 6/10]
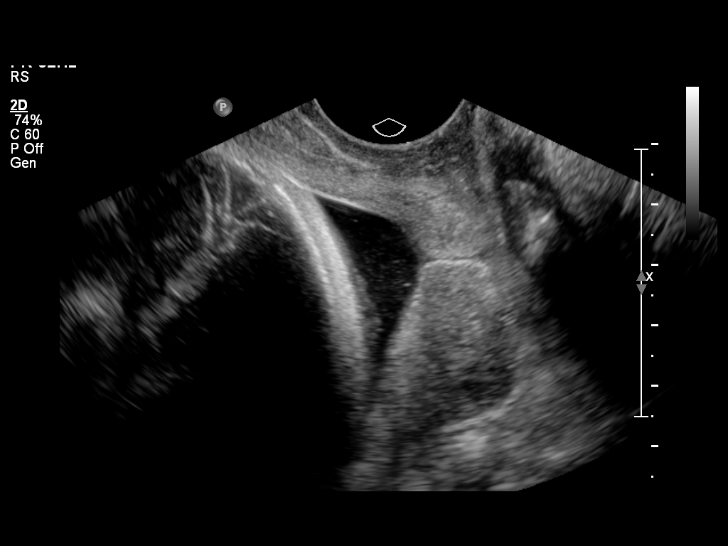
[im 7/10]
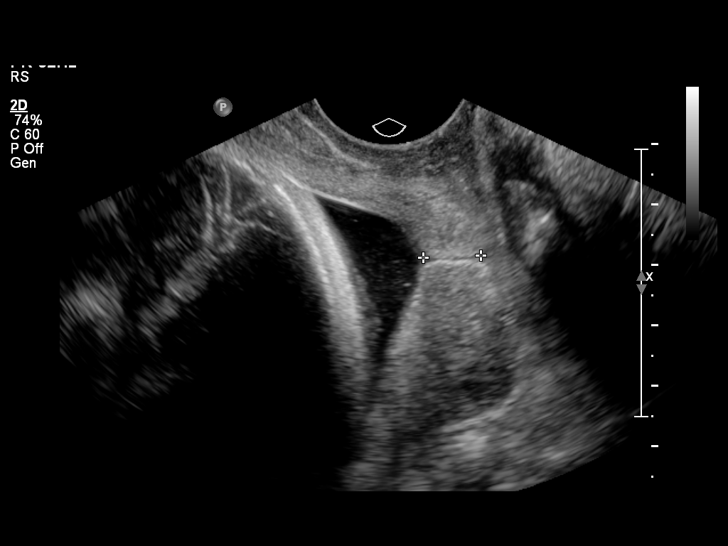
[im 8/10]
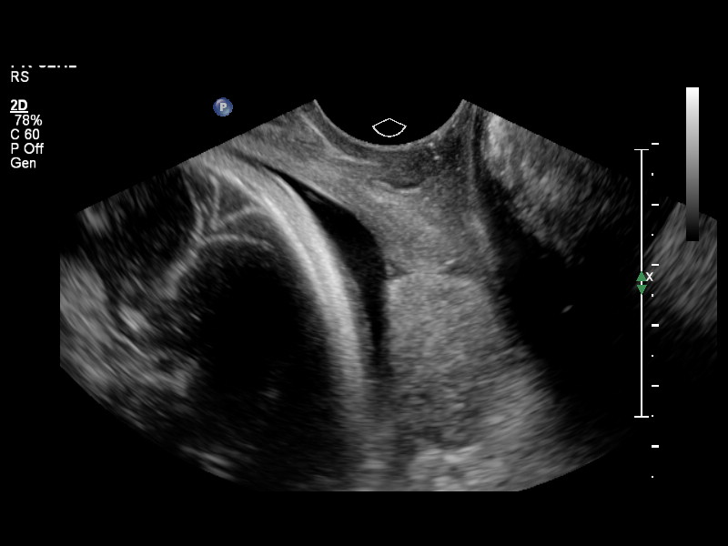
[im 9/10]
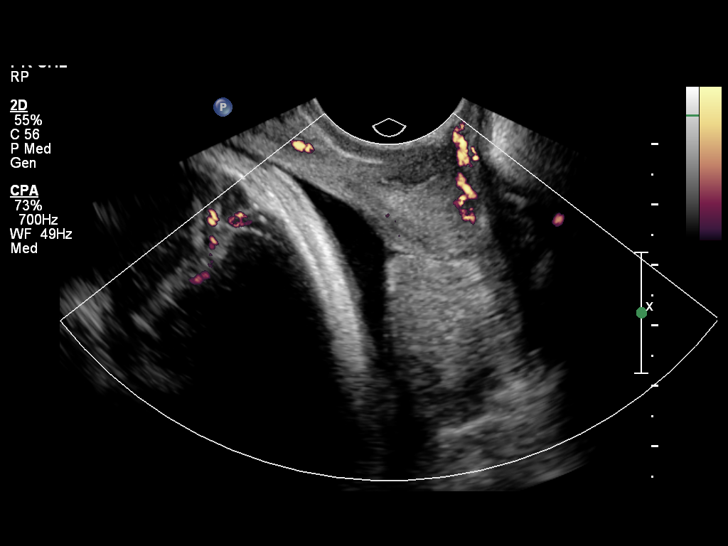
[im 10/10]
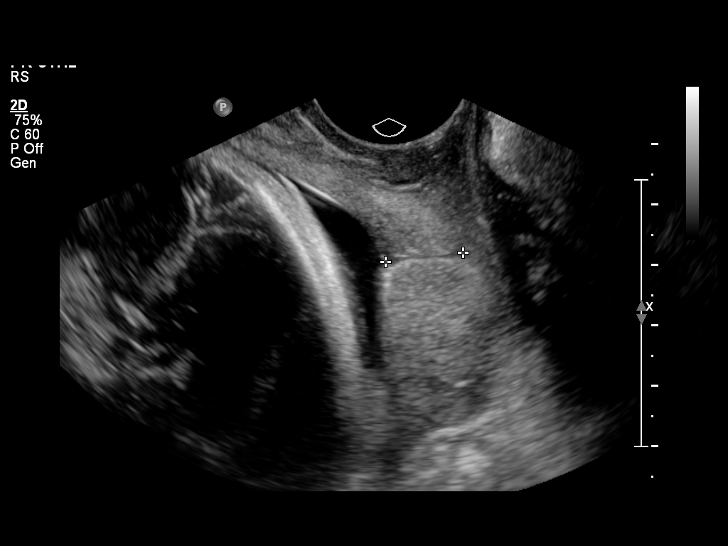

[10 of 10 positions shown; findings below may reference images not displayed]

OBSTETRICS REPORT
                      (Signed Final 12/07/2012 [DATE])

             MATSUMOTO

Service(s) Provided

 US OB TRANSVAGINAL                                    76817.0
Indications

 Cervical shortening
 Pain - Abdominal/Pelvic(Cramping)
Fetal Evaluation

 Num Of Fetuses:    1
 Fetal Heart Rate:  143                          bpm
 Cardiac Activity:  Observed
 Presentation:      Cephalic
Gestational Age

 LMP:           31w 5d        Date:  04/29/12                 EDD:   02/03/13
 Best:          31w 5d     Det. By:  LMP  (04/29/12)          EDD:   02/03/13
Cervix Uterus Adnexa

 Cervical Length:    1.3       cm

 Cervix:       Measured transvaginally. Dynamic N[place]le ranging from
               1.3cm to .9cm with Morsidi Mirol
Impression

 Single IUP at 31 [DATE] weeks
 Limited ultrasound performed for cervical length evaluation

 TVUS - cervical length 1.3 cm with some dynamic changes
 (stable since last evaluation)
Recommendations

 Follow-up ultrasounds as clinically indicated.

 BOFELO KING LAST MAMELA with us.  Please do not hesitate to

## 2014-12-16 ENCOUNTER — Ambulatory Visit: Payer: Managed Care, Other (non HMO) | Admitting: Medical

## 2014-12-18 ENCOUNTER — Emergency Department (HOSPITAL_COMMUNITY)
Admission: EM | Admit: 2014-12-18 | Discharge: 2014-12-19 | Disposition: A | Discharge status: Home / Self Care | Payer: Managed Care, Other (non HMO) | Attending: Emergency Medicine | Admitting: Emergency Medicine

## 2014-12-18 ENCOUNTER — Encounter (HOSPITAL_COMMUNITY): Payer: Self-pay | Admitting: Emergency Medicine

## 2014-12-18 DIAGNOSIS — T364X5A Adverse effect of tetracyclines, initial encounter: Secondary | ICD-10-CM | POA: Diagnosis not present

## 2014-12-18 DIAGNOSIS — R21 Rash and other nonspecific skin eruption: Secondary | ICD-10-CM | POA: Diagnosis not present

## 2014-12-18 DIAGNOSIS — R51 Headache: Secondary | ICD-10-CM | POA: Diagnosis present

## 2014-12-18 DIAGNOSIS — R0789 Other chest pain: Secondary | ICD-10-CM

## 2014-12-18 DIAGNOSIS — Z79899 Other long term (current) drug therapy: Secondary | ICD-10-CM | POA: Diagnosis not present

## 2014-12-18 DIAGNOSIS — F419 Anxiety disorder, unspecified: Secondary | ICD-10-CM | POA: Diagnosis not present

## 2014-12-18 DIAGNOSIS — R232 Flushing: Secondary | ICD-10-CM | POA: Insufficient documentation

## 2014-12-18 DIAGNOSIS — Z3202 Encounter for pregnancy test, result negative: Secondary | ICD-10-CM | POA: Diagnosis not present

## 2014-12-18 DIAGNOSIS — T50905A Adverse effect of unspecified drugs, medicaments and biological substances, initial encounter: Secondary | ICD-10-CM

## 2014-12-18 DIAGNOSIS — R220 Localized swelling, mass and lump, head: Secondary | ICD-10-CM | POA: Diagnosis not present

## 2014-12-18 DIAGNOSIS — G43909 Migraine, unspecified, not intractable, without status migrainosus: Secondary | ICD-10-CM | POA: Insufficient documentation

## 2014-12-18 DIAGNOSIS — G43009 Migraine without aura, not intractable, without status migrainosus: Secondary | ICD-10-CM

## 2014-12-18 DIAGNOSIS — T370X5A Adverse effect of sulfonamides, initial encounter: Secondary | ICD-10-CM | POA: Diagnosis not present

## 2014-12-18 DIAGNOSIS — Z880 Allergy status to penicillin: Secondary | ICD-10-CM | POA: Insufficient documentation

## 2014-12-18 MED ORDER — FAMOTIDINE IN NACL 20-0.9 MG/50ML-% IV SOLN
20.0000 mg | Freq: Once | INTRAVENOUS | Status: AC
  Administered 2014-12-18: 20 mg via INTRAVENOUS
  Filled 2014-12-18: qty 50

## 2014-12-18 MED ORDER — ONDANSETRON HCL 4 MG/2ML IJ SOLN
4.0000 mg | Freq: Once | INTRAMUSCULAR | Status: DC
  Filled 2014-12-18: qty 2

## 2014-12-18 MED ORDER — KETOROLAC TROMETHAMINE 30 MG/ML IJ SOLN
30.0000 mg | Freq: Once | INTRAMUSCULAR | Status: AC
  Administered 2014-12-18: 30 mg via INTRAVENOUS
  Filled 2014-12-18: qty 1

## 2014-12-18 MED ORDER — METHYLPREDNISOLONE SODIUM SUCC 125 MG IJ SOLR
125.0000 mg | Freq: Once | INTRAMUSCULAR | Status: AC
  Administered 2014-12-18: 125 mg via INTRAVENOUS
  Filled 2014-12-18: qty 2

## 2014-12-18 MED ORDER — SODIUM CHLORIDE 0.9 % IV BOLUS (SEPSIS)
1000.0000 mL | Freq: Once | INTRAVENOUS | Status: AC
  Administered 2014-12-18: 1000 mL via INTRAVENOUS

## 2014-12-18 MED ORDER — DIPHENHYDRAMINE HCL 50 MG/ML IJ SOLN
25.0000 mg | Freq: Once | INTRAMUSCULAR | Status: AC
  Administered 2014-12-18: 25 mg via INTRAVENOUS
  Filled 2014-12-18: qty 1

## 2014-12-18 NOTE — ED Notes (Signed)
Pt taken to xray 

## 2014-12-18 NOTE — ED Notes (Signed)
Pt reports ? Allergic reaction.  Reports headache and nausea x 1 week.  [facility] [place] [facility] for L ear infection.  Took Bactrim and Doxycycline just prior to going to bed last night.  Woke up with facial swelling, redness, and generalized body aches this morning.  Also reports rash to L palm x 2 weeks.

## 2014-12-18 NOTE — ED Provider Notes (Signed)
By signing my name below, I, Freida Busman, attest that this documentation has been prepared under the direction and in the presence of Kristen N Ward, DO . Electronically Signed: Freida Busman, Scribe. 12/18/2014. 11:36 PM.  TIME SEEN: 11:20 PM  CHIEF COMPLAINT: Possible Allergic Reaction   HPI:  HPI Comments:  Lisa Berg is a 29 y.o. female with a history of migraine HAs and allergic reactions to multiple antibiotics, who presents to the Emergency Department complaining of generalized  body aches, and swelling to her face and lips, diffuse flushing onset last night while sleeping. She reports associated HA, and mild nausea, sharp central chest pain. No shortness of breath or wheezing. Pt reports taking first dose of doxycycline and bactrim ~2130/2200 last night. They were prescribed for an ear infection which was diagnosed at an  urgent care. She denies fever, diarrhea, vomiting, dysuria, and hematuria. She also denies use of new soaps, lotions and detergents. No new foods. No hives. No wheezing. No dizziness. States that the headache she is having currently feels like her previous migraines. No numbness, tingling or focal weakness. No head injury. No fever.  Pt also notes rash to left palm for 2 weeks; notes the rash is occasionally pruritic and painful. No alleviating factors noted. Pt works in a daycare. She denies h/o syphillis and other STDs; states she has been tested in the past. Is sexually active with her fianc. Denies vaginal bleeding, discharge, dysuria or hematuria.  LNMP Nov 20th, 2016   ROS: See HPI  Constitutional: no fever  Eyes: no drainage  ENT: no runny nose   Resp: no SOB  GI: no vomiting GU: no dysuria Integumentary: rash  Allergy: no hives  Musculoskeletal: no leg swelling  Neurological: no slurred speech ROS otherwise negative  PAST MEDICAL HISTORY/PAST SURGICAL HISTORY:  Past Medical History  Diagnosis Date  . Migraine   . Allergy   . Anxiety      MEDICATIONS:  Prior to Admission medications   Medication Sig Start Date End Date Taking? Authorizing Provider  busPIRone (BUSPAR) 30 MG tablet Take 1 tablet (30 mg total) by mouth 2 (two) times daily. 10/22/14   Sheliah Hatch, MD  Cyanocobalamin (VITAMIN B 12 PO) Take 1 tablet by mouth at bedtime.     Historical Provider, MD  EPINEPHrine 0.3 mg/0.3 mL IJ SOAJ injection Inject 0.3 mLs (0.3 mg total) into the muscle once as needed (Use as directed for allergy reaction.). 09/22/14   Sheliah Hatch, MD  GILDESS FE 1/20 1-20 MG-MCG tablet  09/09/14   Historical Provider, MD  Prenatal Vit-Fe Fumarate-FA (PRENATAL MULTIVITAMIN) TABS tablet Take 1 tablet by mouth at bedtime.     Historical Provider, MD  topiramate (TOPAMAX) 200 MG tablet Take 1 tablet (200 mg total) by mouth daily. 09/22/14   Sheliah Hatch, MD    ALLERGIES:  Allergies  Allergen Reactions  . Clindamycin/Lincomycin Hives  . Bee Venom Swelling  . Latex Rash  . Orange Concentrate [Flavoring Agent] Rash  . Penicillins Rash    SOCIAL HISTORY:  Social History  Substance Use Topics  . Smoking status: Never Smoker   . Smokeless tobacco: Never Used  . Alcohol Use: No    FAMILY HISTORY: Family History  Problem Relation Age of Onset  . Cancer Mother   . Depression Mother   . Hyperlipidemia Mother   . Hyperlipidemia Father   . Heart disease Father   . Hypertension Father   . Hyperlipidemia Sister   .  Stroke Maternal Aunt   . Miscarriages / Stillbirths Maternal Grandmother   . Diabetes Maternal Grandfather   . Heart disease Maternal Grandfather   . Asthma Neg Hx   . Birth defects Neg Hx   . COPD Neg Hx   . Drug abuse Neg Hx   . Hearing loss Neg Hx   . Kidney disease Neg Hx   . Learning disabilities Neg Hx     EXAM: BP 106/72 mmHg  Pulse 100  Temp(Src) 98.5 F (36.9 C) (Oral)  Resp 16  SpO2 100%  LMP 11/29/2014 CONSTITUTIONAL: Alert and oriented and responds appropriately to questions.  Well-appearing; well-nourished, nontoxic HEAD: Normocephalic EYES: PERRL; Conjunctivae injected bilaterally without drainage.  ENT: normal nose; no rhinorrhea; moist mucous membranes; pharynx without lesions noted. TMs clear bilaterally; no bulging, purulence, or perforation. Inferior portion of left pinna with small scaly erythematous plaque-like lesion without drainage; no sign of mastoiditis , no angioedema appreciated on exam. No significant facial swelling or Ludwig's angina. No dental abscess. No tonsillar hypertrophy or exudate. No uvular deviation. No trismus or drooling. Normal phonation. No stridor. NECK: Supple, no meningismus, no LAD  CARD: RRR; S1 and S2 appreciated; no murmurs, no clicks, no rubs, no gallops RESP: Normal chest excursion without splinting or tachypnea; breath sounds clear and equal bilaterally; no wheezes, no rhonchi, no rales, no hypoxia or respiratory distress, speaking full sentences ABD/GI: Normal bowel sounds; non-distended; soft, non-tender, no rebound, no guarding, no peritoneal signs BACK:  The back appears normal and is non-tender to palpation, there is no CVA tenderness EXT: Normal ROM in all joints; non-tender to palpation; no edema; normal capillary refill; no cyanosis, no calf tenderness or swelling    SKIN: Normal color for age and race; warm. Pt appears flushed diffusely with no hives, petechia, purpura, blisters or desquamation. Multiple slight erythematous small papular regions noted to palmar aspect of bilateral hands, with no lesions on sole or mucous membranes. NEURO: Moves all extremities equally, sensation to light touch intact diffusely, cranial nerves II through XII intact PSYCH: The patient's mood and manner are appropriate. Grooming and personal hygiene are appropriate.   EKG Interpretation  Date/Time:  Saturday December 19 2014 00:23:03 EST Ventricular Rate:  86 PR Interval:  163 QRS Duration: 86 QT Interval:  456 QTC Calculation: 545 R  Axis:   93 Text Interpretation:  Sinus rhythm Borderline right axis deviation Nonspecific T abnrm, anterolateral leads Prolonged QT interval No old tracing to compare Confirmed by WARD,  DO, KRISTEN ([id number]) on 12/19/2014 12:25:22 AM       MEDICAL DECISION MAKING: Patient here with possible adverse reaction, allergic reaction to either Bactrim or doxycycline. Symptoms started 1-2 hours after taking his medications. She does appear flushed on exam but no hives. I don't appreciate any angioedema but she feels her lips and tongue are swollen as well as her face. She is speaking normally and swelling her and secretions. No wheezing. We'll give IV fluids, Benadryl, Solu-Medrol and Pepcid. At this time I do not feel she needs epinephrine.   Patient also complaining of atypical chest pain. No history of PE, DVT, exogenous estrogen use, fracture, surgery, trauma, hospitalization, prolonged travel. No lower extremity swelling or pain. No calf tenderness. She is PERC negative.  EKG shows nonspecific T-wave abnormalities with no old for comparison. Will obtain chest x-ray and continue to monitor patient. Doubt ACS, PE, dissection.   Patient also complaining of diffuse throbbing headache that is similar to her prior migraines. We'll give  Toradol, Zofran and reassess. No head injury. No neurologic deficits. I do not feel she needs emergent imaging.   ED PROGRESS: Patient's labs are unremarkable other than leukocytosis with left shift. She is not pregnant. Chest x-ray clear. Her symptoms have completely resolved after above medications. Her flushed appearance and conjunctival injection is now gone. She does have slightly low blood pressure on exam but no tachycardia and is a thin, otherwise healthy female. Suspect that this is chronic for patient. She does not feel lightheaded. I have recommend she stop taking Bactrim and doxycycline. Will discharge on Keflex for the small infection noted to the left pinna.  Mother  reports patient has taken Keflex before without any issue.  As for the rash on patient's hands, it is not typical for syphilis but have sent an RPR as well as HIV test. It does not look like hand-foot-and-mouth disease either. I recommended she follow-up with her primary care physician if this continues. Some of the lesions look like atopic dermatitis and discussed with patient that Benadryl and steroids would likely help this as well.  Discussed return precautions with patient and family at bedside. They verbalize understanding and are comfortable with this plan.   I personally performed the services described in this documentation, which was scribed in my presence. The recorded information has been reviewed and is accurate.   Layla Maw.  Kristen N Ward, DO 12/19/14 [phone]

## 2014-12-19 ENCOUNTER — Emergency Department (HOSPITAL_COMMUNITY): Payer: Managed Care, Other (non HMO)

## 2014-12-19 LAB — CBC WITH DIFFERENTIAL/PLATELET
BASOS ABS: 0 10*3/uL (ref 0.0–0.1)
BASOS PCT: 0 %
EOS ABS: 0.4 10*3/uL (ref 0.0–0.7)
Eosinophils Relative: 4 %
HCT: 41.2 % (ref 36.0–46.0)
HEMOGLOBIN: 13.6 g/dL (ref 12.0–15.0)
Lymphocytes Relative: 12 %
Lymphs Abs: 1.3 10*3/uL (ref 0.7–4.0)
MCH: 29.8 pg (ref 26.0–34.0)
MCHC: 33 g/dL (ref 30.0–36.0)
MCV: 90.4 fL (ref 78.0–100.0)
MONOS PCT: 5 %
Monocytes Absolute: 0.6 10*3/uL (ref 0.1–1.0)
NEUTROS ABS: 8.6 10*3/uL — AB (ref 1.7–7.7)
NEUTROS PCT: 79 %
Platelets: 197 10*3/uL (ref 150–400)
RBC: 4.56 MIL/uL (ref 3.87–5.11)
RDW: 13.6 % (ref 11.5–15.5)
WBC: 10.9 10*3/uL — ABNORMAL HIGH (ref 4.0–10.5)

## 2014-12-19 LAB — BASIC METABOLIC PANEL
ANION GAP: 7 (ref 5–15)
BUN: 10 mg/dL (ref 6–20)
CALCIUM: 8.7 mg/dL — AB (ref 8.9–10.3)
CO2: 17 mmol/L — ABNORMAL LOW (ref 22–32)
CREATININE: 0.97 mg/dL (ref 0.44–1.00)
Chloride: 111 mmol/L (ref 101–111)
GFR calc non Af Amer: >60 mL/min (ref >60)
Glucose, Bld: 95 mg/dL (ref 65–99)
Potassium: 3.4 mmol/L — ABNORMAL LOW (ref 3.5–5.1)
SODIUM: 135 mmol/L (ref 135–145)

## 2014-12-19 LAB — I-STAT BETA HCG BLOOD, ED (MC, WL, AP ONLY)

## 2014-12-19 MED ORDER — PREDNISONE 20 MG PO TABS: 60.0000 mg | ORAL_TABLET | Freq: Every day | ORAL | Status: DC

## 2014-12-19 MED ORDER — ONDANSETRON 4 MG PO TBDP: 4.0000 mg | ORAL_TABLET | Freq: Three times a day (TID) | ORAL | Status: DC | PRN

## 2014-12-19 MED ORDER — CEPHALEXIN 500 MG PO CAPS: 500.0000 mg | ORAL_CAPSULE | Freq: Four times a day (QID) | ORAL | Status: DC

## 2014-12-19 NOTE — ED Notes (Signed)
Provider at bedside

## 2014-12-19 NOTE — ED Notes (Signed)
Pt requesting food; crackers and coca cola given to patient. Generalized redness decreased since medications. Cycling blood pressures noted to be in the mid-80s systolic. Pt noted to be lying on L side during vital signs

## 2014-12-19 NOTE — Discharge Instructions (Signed)
Please stop taking your Bactrim and doxycycline. Please start taking Keflex for the infection on the outside of your ear. If symptoms return he may take Benadryl 50 mg every 8 hours. We are discharging you on steroids to take for the next 4 days. We have drawn labs to evaluate the rash on your hands. If they are positive, you will be contacted. Please follow-up with your doctor for further management.  Nonspecific Chest Pain  Chest pain can be caused by many different conditions. There is always a chance that your pain could be related to something serious, such as a heart attack or a blood clot in your lungs. Chest pain can also be caused by conditions that are not life-threatening. If you have chest pain, it is very important to follow up with your health care provider. CAUSES  Chest pain can be caused by:  Heartburn.  Pneumonia or bronchitis.  Anxiety or stress.  Inflammation around your heart (pericarditis) or lung (pleuritis or pleurisy).  A blood clot in your lung.  A collapsed lung (pneumothorax). It can develop suddenly on its own (spontaneous pneumothorax) or from trauma to the chest.  Shingles infection (varicella-zoster virus).  Heart attack.  Damage to the bones, muscles, and cartilage that make up your chest wall. This can include:  Bruised bones due to injury.  Strained muscles or cartilage due to frequent or repeated coughing or overwork.  Fracture to one or more ribs.  Sore cartilage due to inflammation (costochondritis). RISK FACTORS  Risk factors for chest pain may include:  Activities that increase your risk for trauma or injury to your chest.  Respiratory infections or conditions that cause frequent coughing.  Medical conditions or overeating that can cause heartburn.  Heart disease or family history of heart disease.  Conditions or health behaviors that increase your risk of developing a blood clot.  Having had chicken pox (varicella zoster). SIGNS  AND SYMPTOMS Chest pain can feel like:  Burning or tingling on the surface of your chest or deep in your chest.  Crushing, pressure, aching, or squeezing pain.  Dull or sharp pain that is worse when you move, cough, or take a deep breath.  Pain that is also felt in your back, neck, shoulder, or arm, or pain that spreads to any of these areas. Your chest pain may come and go, or it may stay constant. DIAGNOSIS Lab tests or other studies may be needed to find the cause of your pain. Your health care provider may have you take a test called an ambulatory ECG (electrocardiogram). An ECG records your heartbeat patterns at the time the test is performed. You may also have other tests, such as:  Transthoracic echocardiogram (TTE). During echocardiography, sound waves are used to create a picture of all of the heart structures and to look at how blood flows through your heart.  Transesophageal echocardiogram (TEE).This is a more advanced imaging test that obtains images from inside your body. It allows your health care provider to see your heart in finer detail.  Cardiac monitoring. This allows your health care provider to monitor your heart rate and rhythm in real time.  Holter monitor. This is a portable device that records your heartbeat and can help to diagnose abnormal heartbeats. It allows your health care provider to track your heart activity for several days, if needed.  Stress tests. These can be done through exercise or by taking medicine that makes your heart beat more quickly.  Blood tests.  Imaging tests.  TREATMENT  Your treatment depends on what is causing your chest pain. Treatment may include:  Medicines. These may include:  Acid blockers for heartburn.  Anti-inflammatory medicine.  Pain medicine for inflammatory conditions.  Antibiotic medicine, if an infection is present.  Medicines to dissolve blood clots.  Medicines to treat coronary artery  disease.  Supportive care for conditions that do not require medicines. This may include:  Resting.  Applying heat or cold packs to injured areas.  Limiting activities until pain decreases. HOME CARE INSTRUCTIONS  If you were prescribed an antibiotic medicine, finish it all even if you start to feel better.  Avoid any activities that bring on chest pain.  Do not use any tobacco products, including cigarettes, chewing tobacco, or electronic cigarettes. If you need help quitting, ask your health care provider.  Do not drink alcohol.  Take medicines only as directed by your health care provider.  Keep all follow-up visits as directed by your health care provider. This is important. This includes any further testing if your chest pain does not go away.  If heartburn is the cause for your chest pain, you may be told to keep your head raised (elevated) while sleeping. This reduces the chance that acid will go from your stomach into your esophagus.  Make lifestyle changes as directed by your health care provider. These may include:  Getting regular exercise. Ask your health care provider to suggest some activities that are safe for you.  Eating a heart-healthy diet. A registered dietitian can help you to learn healthy eating options.  Maintaining a healthy weight.  Managing diabetes, if necessary.  Reducing stress. SEEK MEDICAL CARE IF:  Your chest pain does not go away after treatment.  You have a rash with blisters on your chest.  You have a fever. SEEK IMMEDIATE MEDICAL CARE IF:   Your chest pain is worse.  You have an increasing cough, or you cough up blood.  You have severe abdominal pain.  You have severe weakness.  You faint.  You have chills.  You have sudden, unexplained chest discomfort.  You have sudden, unexplained discomfort in your arms, back, neck, or jaw.  You have shortness of breath at any time.  You suddenly start to sweat, or your skin gets  clammy.  You feel nauseous or you vomit.  You suddenly feel light-headed or dizzy.  Your heart begins to beat quickly, or it feels like it is skipping beats. These symptoms may represent a serious problem that is an emergency. Do not wait to see if the symptoms will go away. Get medical help right away. Call your local emergency services (911 in the [place] Do not drive yourself to the hospital.   This information is not intended to replace advice given to you by your health care provider. Make sure you discuss any questions you have with your health care provider.   Document Released: 10/05/2004 Document Revised: 01/16/2014 Document Reviewed: 08/01/2013 Elsevier Interactive Patient Education 2016 [employer].  Migraine Headache A migraine headache is an intense, throbbing pain on one or both sides of your head. A migraine can last for 30 minutes to several hours. CAUSES  The exact cause of a migraine headache is not always known. However, a migraine may be caused when nerves in the brain become irritated and release chemicals that cause inflammation. This causes pain. Certain things may also trigger migraines, such as:  Alcohol.  Smoking.  Stress.  Menstruation.  Aged cheeses.  Foods or drinks  that contain nitrates, glutamate, aspartame, or tyramine.  Lack of sleep.  Chocolate.  Caffeine.  Hunger.  Physical exertion.  Fatigue.  Medicines used to treat chest pain (nitroglycerine), birth control pills, estrogen, and some blood pressure medicines. SIGNS AND SYMPTOMS  Pain on one or both sides of your head.  Pulsating or throbbing pain.  Severe pain that prevents daily activities.  Pain that is aggravated by any physical activity.  Nausea, vomiting, or both.  Dizziness.  Pain with exposure to bright lights, loud noises, or activity.  General sensitivity to bright lights, loud noises, or smells. Before you get a migraine, you may get warning signs that a  migraine is coming (aura). An aura may include:  Seeing flashing lights.  Seeing bright spots, halos, or zigzag lines.  Having tunnel vision or blurred vision.  Having feelings of numbness or tingling.  Having trouble talking.  Having muscle weakness. DIAGNOSIS  A migraine headache is often diagnosed based on:  Symptoms.  Physical exam.  A CT scan or MRI of your head. These imaging tests cannot diagnose migraines, but they can help rule out other causes of headaches. TREATMENT Medicines may be given for pain and nausea. Medicines can also be given to help prevent recurrent migraines.  HOME CARE INSTRUCTIONS  Only take over-the-counter or prescription medicines for pain or discomfort as directed by your health care provider. The use of long-term narcotics is not recommended.  Lie down in a dark, quiet room when you have a migraine.  Keep a journal to find out what may trigger your migraine headaches. For example, write down:  What you eat and drink.  How much sleep you get.  Any change to your diet or medicines.  Limit alcohol consumption.  Quit smoking if you smoke.  Get 7-9 hours of sleep, or as recommended by your health care provider.  Limit stress.  Keep lights dim if bright lights bother you and make your migraines worse. SEEK IMMEDIATE MEDICAL CARE IF:   Your migraine becomes severe.  You have a fever.  You have a stiff neck.  You have vision loss.  You have muscular weakness or loss of muscle control.  You start losing your balance or have trouble walking.  You feel faint or pass out.  You have severe symptoms that are different from your first symptoms. MAKE SURE YOU:   Understand these instructions.  Will watch your condition.  Will get help right away if you are not doing well or get worse.   This information is not intended to replace advice given to you by your health care provider. Make sure you discuss any questions you have with  your health care provider.   Document Released: 12/26/2004 Document Revised: 01/16/2014 Document Reviewed: 09/02/2012 Elsevier Interactive Patient Education 2016 [employer].  Drug Allergy Allergic reactions to medicines are common. Some allergic reactions are mild. A delayed type of drug allergy that occurs 1 week or more after exposure to a medicine or vaccine is called serum sickness. A life-threatening, sudden (acute) allergic reaction that involves the whole body is called anaphylaxis. CAUSES  "True" drug allergies occur when there is an allergic reaction to a medicine. This is caused by overactivity of the immune system. First, the body becomes sensitized. The immune system is triggered by your first exposure to the medicine. Following this first exposure, future exposure to the same medicine may be life-threatening. Almost any medicine can cause an allergic reaction. Common ones are:  Penicillin.  Sulfonamides (  sulfa drugs).  Local anesthetics.  X-ray dyes that contain iodine. SYMPTOMS  Common symptoms of a minor allergic reaction are:  Swelling around the mouth.  An itchy red rash or hives.  Vomiting or diarrhea. Anaphylaxis can cause swelling of the mouth and throat. This makes it difficult to breathe and swallow. Severe reactions can be fatal within seconds, even after exposure to only a trace amount of the drug that causes the reaction. HOME CARE INSTRUCTIONS  If you are unsure of what caused your reaction, write down:  The names of the medicines you took.  How much medicine you took.  How you took the medicine, such as whether you took a pill, injected the medicine, or applied it to your skin.  All of the things you ate and drank.  The date and time of your reaction.  The symptoms of the reaction.  You may want to follow up with an allergy specialist after the reaction has cleared in order to be tested to confirm the allergy. It is important to confirm that  your reaction is an allergy, not just a side effect to the medicine. If you have a true allergy to a medicine, this may prevent that medicine and related medicines from being given to you when you are very ill.  If you have hives or a rash:  Take medicines as directed by your caregiver.  You may use an over-the-counter antihistamine (diphenhydramine) as needed.  Apply cold compresses to the skin or take baths in cool water. Avoid hot baths or showers.  If you are severely allergic:  Continuous observation after a severe reaction may be needed. Hospitalization is often required.  Wear a medical alert bracelet or necklace stating your allergy.  You and your family must learn how to use an anaphylaxis kit or give an epinephrine injection to temporarily treat an emergency allergic reaction. If you have had a severe reaction, always carry your epinephrine injection or anaphylaxis kit with you. This can be lifesaving if you have a severe reaction.  Do not drive or perform tasks after treatment until the medicines used to treat your reaction have worn off, or until your caregiver says it is okay.  If you have a drug allergy that was confirmed by your health care provider:  Carry information about the drug allergy with you at all times.  Always check with a pharmacist before taking any over-the-counter medicine. SEEK MEDICAL CARE IF:   You think you had an allergic reaction. Symptoms usually start within 30 minutes after exposure.  Symptoms are getting worse rather than better.  You develop new symptoms.  The symptoms that brought you to your caregiver return. SEEK IMMEDIATE MEDICAL CARE IF:   You have swelling of the mouth, difficulty breathing, or wheezing.  You have a tight feeling in your chest or throat.  You develop hives, swelling, or itching all over your body.  You develop severe vomiting or diarrhea.  You feel faint or pass out. This is an emergency. Use your  epinephrine injection or anaphylaxis kit as you have been instructed. Call for emergency medical help. Even if you improve after the injection, you need to be examined at a hospital emergency department. MAKE SURE YOU:   Understand these instructions.  Will watch your condition.  Will get help right away if you are not doing well or get worse.   This information is not intended to replace advice given to you by your health care provider. Make sure you  discuss any questions you have with your health care provider.   Document Released: 12/26/2004 Document Revised: 01/16/2014 Document Reviewed: 07/28/2014 Elsevier Interactive Patient Education [employer].

## 2014-12-20 LAB — RPR: RPR: NONREACTIVE

## 2014-12-20 LAB — HIV ANTIBODY (ROUTINE TESTING W REFLEX): HIV Screen 4th Generation wRfx: NONREACTIVE

## 2015-01-15 ENCOUNTER — Observation Stay (HOSPITAL_COMMUNITY)
Admission: EM | Admit: 2015-01-15 | Discharge: 2015-01-15 | Disposition: A | Discharge status: Home / Self Care | Payer: Managed Care, Other (non HMO) | Attending: Internal Medicine | Admitting: Internal Medicine

## 2015-01-15 ENCOUNTER — Emergency Department (HOSPITAL_COMMUNITY): Payer: Managed Care, Other (non HMO)

## 2015-01-15 ENCOUNTER — Encounter (HOSPITAL_COMMUNITY): Payer: Self-pay | Admitting: Emergency Medicine

## 2015-01-15 DIAGNOSIS — R079 Chest pain, unspecified: Secondary | ICD-10-CM | POA: Diagnosis not present

## 2015-01-15 DIAGNOSIS — E872 Acidosis: Secondary | ICD-10-CM | POA: Diagnosis not present

## 2015-01-15 DIAGNOSIS — K920 Hematemesis: Principal | ICD-10-CM | POA: Insufficient documentation

## 2015-01-15 DIAGNOSIS — E860 Dehydration: Secondary | ICD-10-CM | POA: Diagnosis not present

## 2015-01-15 DIAGNOSIS — R112 Nausea with vomiting, unspecified: Secondary | ICD-10-CM | POA: Diagnosis present

## 2015-01-15 DIAGNOSIS — R740 Nonspecific elevation of levels of transaminase and lactic acid dehydrogenase [LDH]: Secondary | ICD-10-CM | POA: Insufficient documentation

## 2015-01-15 DIAGNOSIS — R197 Diarrhea, unspecified: Secondary | ICD-10-CM | POA: Diagnosis not present

## 2015-01-15 DIAGNOSIS — F419 Anxiety disorder, unspecified: Secondary | ICD-10-CM | POA: Diagnosis not present

## 2015-01-15 DIAGNOSIS — G43909 Migraine, unspecified, not intractable, without status migrainosus: Secondary | ICD-10-CM | POA: Insufficient documentation

## 2015-01-15 DIAGNOSIS — R7989 Other specified abnormal findings of blood chemistry: Secondary | ICD-10-CM | POA: Diagnosis present

## 2015-01-15 LAB — CBC WITH DIFFERENTIAL/PLATELET
BASOS ABS: 0 10*3/uL (ref 0.0–0.1)
Basophils Relative: 0 %
Eosinophils Absolute: 0 10*3/uL (ref 0.0–0.7)
Eosinophils Relative: 0 %
HCT: 48.8 % — ABNORMAL HIGH (ref 36.0–46.0)
Hemoglobin: 16.3 g/dL — ABNORMAL HIGH (ref 12.0–15.0)
LYMPHS ABS: 1.6 10*3/uL (ref 0.7–4.0)
Lymphocytes Relative: 9 %
MCH: 30.5 pg (ref 26.0–34.0)
MCHC: 33.4 g/dL (ref 30.0–36.0)
MCV: 91.2 fL (ref 78.0–100.0)
MONO ABS: 0 10*3/uL — AB (ref 0.1–1.0)
Monocytes Relative: 0 %
Neutro Abs: 15.8 10*3/uL — ABNORMAL HIGH (ref 1.7–7.7)
Neutrophils Relative %: 91 %
PLATELETS: 225 10*3/uL (ref 150–400)
RBC: 5.35 MIL/uL — AB (ref 3.87–5.11)
RDW: 13.4 % (ref 11.5–15.5)
WBC: 17.4 10*3/uL — AB (ref 4.0–10.5)

## 2015-01-15 LAB — COMPREHENSIVE METABOLIC PANEL
ALK PHOS: 42 U/L (ref 38–126)
ALT: 16 U/L (ref 14–54)
ANION GAP: 13 (ref 5–15)
AST: 22 U/L (ref 15–41)
Albumin: 4.5 g/dL (ref 3.5–5.0)
BILIRUBIN TOTAL: 0.7 mg/dL (ref 0.3–1.2)
BUN: 19 mg/dL (ref 6–20)
CALCIUM: 9.6 mg/dL (ref 8.9–10.3)
CO2: 16 mmol/L — ABNORMAL LOW (ref 22–32)
Chloride: 112 mmol/L — ABNORMAL HIGH (ref 101–111)
Creatinine, Ser: 1.1 mg/dL — ABNORMAL HIGH (ref 0.44–1.00)
GFR calc non Af Amer: >60 mL/min (ref >60)
Glucose, Bld: 183 mg/dL — ABNORMAL HIGH (ref 65–99)
Potassium: 4.1 mmol/L (ref 3.5–5.1)
SODIUM: 141 mmol/L (ref 135–145)
TOTAL PROTEIN: 8.2 g/dL — AB (ref 6.5–8.1)

## 2015-01-15 LAB — I-STAT CG4 LACTIC ACID, ED
Lactic Acid, Venous: 2.23 mmol/L (ref 0.5–2.0)
Lactic Acid, Venous: 3.12 mmol/L (ref 0.5–2.0)

## 2015-01-15 LAB — URINE MICROSCOPIC-ADD ON: RBC / HPF: NONE SEEN RBC/hpf (ref 0–5)

## 2015-01-15 LAB — URINALYSIS, ROUTINE W REFLEX MICROSCOPIC
GLUCOSE, UA: 100 mg/dL — AB
Hgb urine dipstick: NEGATIVE
KETONES UR: 15 mg/dL — AB
LEUKOCYTES UA: NEGATIVE
NITRITE: NEGATIVE
PROTEIN: 100 mg/dL — AB
Specific Gravity, Urine: 1.021 (ref 1.005–1.030)
pH: 6 (ref 5.0–8.0)

## 2015-01-15 LAB — LACTIC ACID, PLASMA: Lactic Acid, Venous: 1.7 mmol/L (ref 0.5–2.0)

## 2015-01-15 MED ORDER — PROMETHAZINE HCL 12.5 MG RE SUPP
12.5000 mg | Freq: Four times a day (QID) | RECTAL | Status: DC | PRN
  Filled 2015-01-15: qty 1

## 2015-01-15 MED ORDER — DICYCLOMINE HCL 10 MG/ML IM SOLN
20.0000 mg | Freq: Once | INTRAMUSCULAR | Status: AC
  Administered 2015-01-15: 20 mg via INTRAMUSCULAR
  Filled 2015-01-15: qty 2

## 2015-01-15 MED ORDER — BUSPIRONE HCL 10 MG PO TABS
30.0000 mg | ORAL_TABLET | Freq: Two times a day (BID) | ORAL | Status: DC
  Administered 2015-01-15: 30 mg via ORAL
  Filled 2015-01-15: qty 3

## 2015-01-15 MED ORDER — ONDANSETRON 4 MG PO TBDP: 4.0000 mg | ORAL_TABLET | Freq: Three times a day (TID) | ORAL | Status: DC | PRN

## 2015-01-15 MED ORDER — NORETHIN ACE-ETH ESTRAD-FE 1-20 MG-MCG PO TABS: 1.0000 | ORAL_TABLET | Freq: Every day | ORAL | Status: DC

## 2015-01-15 MED ORDER — ONDANSETRON HCL 4 MG/2ML IJ SOLN
4.0000 mg | Freq: Once | INTRAMUSCULAR | Status: AC
  Administered 2015-01-15: 4 mg via INTRAVENOUS
  Filled 2015-01-15: qty 2

## 2015-01-15 MED ORDER — SODIUM CHLORIDE 0.9 % IV SOLN
INTRAVENOUS | Status: AC
  Administered 2015-01-15: 09:00:00 via INTRAVENOUS

## 2015-01-15 MED ORDER — PROMETHAZINE HCL 25 MG PO TABS: 12.5000 mg | ORAL_TABLET | Freq: Four times a day (QID) | ORAL | Status: DC | PRN

## 2015-01-15 MED ORDER — KCL IN DEXTROSE-NACL 20-5-0.45 MEQ/L-%-% IV SOLN
Freq: Once | INTRAVENOUS | Status: AC
  Administered 2015-01-15: 05:00:00 via INTRAVENOUS
  Filled 2015-01-15: qty 1000

## 2015-01-15 MED ORDER — TOPIRAMATE 25 MG PO TABS: 200.0000 mg | ORAL_TABLET | Freq: Every day | ORAL | Status: DC

## 2015-01-15 MED ORDER — ACETAMINOPHEN 325 MG PO TABS: 650.0000 mg | ORAL_TABLET | Freq: Four times a day (QID) | ORAL | Status: DC | PRN

## 2015-01-15 MED ORDER — ONDANSETRON HCL 4 MG PO TABS: 4.0000 mg | ORAL_TABLET | Freq: Four times a day (QID) | ORAL | Status: DC | PRN

## 2015-01-15 MED ORDER — PROMETHAZINE HCL 25 MG/ML IJ SOLN
12.5000 mg | Freq: Four times a day (QID) | INTRAMUSCULAR | Status: DC | PRN
  Filled 2015-01-15: qty 1

## 2015-01-15 MED ORDER — SODIUM CHLORIDE 0.9 % IV BOLUS (SEPSIS)
1000.0000 mL | Freq: Once | INTRAVENOUS | Status: AC
  Administered 2015-01-15: 1000 mL via INTRAVENOUS

## 2015-01-15 MED ORDER — ACETAMINOPHEN 650 MG RE SUPP: 650.0000 mg | Freq: Four times a day (QID) | RECTAL | Status: DC | PRN

## 2015-01-15 MED ORDER — ONDANSETRON HCL 4 MG/2ML IJ SOLN: 4.0000 mg | Freq: Four times a day (QID) | INTRAMUSCULAR | Status: DC | PRN

## 2015-01-15 NOTE — ED Notes (Signed)
Pt. reports emesis and diarrhea onset 3 pm yesterday with chills and generalized body aches/fatigue .

## 2015-01-15 NOTE — ED Notes (Signed)
Phlebotomy called.  

## 2015-01-15 NOTE — ED Notes (Signed)
Admitting notified of lactic acid results sts will come down to see and discharge patient. Flow manager made aware.

## 2015-01-15 NOTE — ED Provider Notes (Signed)
CSN: [id number]     Arrival date & time 01/15/15  0025 History   By signing my name below, I, Arlan Organshley Leger, attest that this documentation has been prepared under the direction and in the presence of Gerhard Munchobert Fayette Gasner, MD.  Electronically Signed: Arlan OrganAshley Leger, ED Scribe. 01/15/2015. 1:31 AM.   Chief Complaint  Patient presents with  . Emesis  . Diarrhea   The history is provided by the patient. No language interpreter was used.    HPI Comments: Lisa Berg is a 30 y.o. female who presents to the Emergency Department complaining of constant, ongoing vomiting with associated nausea onset 3 PM this afternoon. Pt is unable to keep any solid foods or liquids down at this time.  No abdominal pain. She also reports ongoing diarrhea, dizziness, and lightheaded,ess. . No aggravating or alleviating factors at this time. Prescribed Zofran attempted at home without any improvement. No recent fever, chills, cough, congestion, abdominal pain, chest pain, or shortness of breath. She is not a smoker. No history of every day alcohol use. Pt denies any recent long distance travel. Son currently has RSV and similar nausea, vomiting symptoms.  PCP: Neena RhymesKatherine Tabori, MD    Past Medical History  Diagnosis Date  . Migraine   . Allergy   . Anxiety    Past Surgical History  Procedure Laterality Date  . Wisdom tooth extraction    . Cesarean section N/A 01/03/2013    Procedure: CESAREAN SECTION;  Surgeon: Leslie AndreaJames E Tomblin II, MD;  Location: [facility];  Service: Obstetrics;  Laterality: N/A;   Family History  Problem Relation Age of Onset  . Cancer Mother   . Depression Mother   . Hyperlipidemia Mother   . Hyperlipidemia Father   . Heart disease Father   . Hypertension Father   . Hyperlipidemia Sister   . Stroke Maternal Aunt   . Miscarriages / Stillbirths Maternal Grandmother   . Diabetes Maternal Grandfather   . Heart disease Maternal Grandfather   . Asthma Neg Hx   . Birth defects Neg Hx   . COPD  Neg Hx   . Drug abuse Neg Hx   . Hearing loss Neg Hx   . Kidney disease Neg Hx   . Learning disabilities Neg Hx    Social History  Substance Use Topics  . Smoking status: Never Smoker   . Smokeless tobacco: Never Used  . Alcohol Use: No   OB History    Gravida Para Term Preterm AB TAB SAB Ectopic Multiple Living   1 1  1      1      Review of Systems  Constitutional: Negative for fever and chills.  HENT: Negative.   Respiratory: Negative for cough and shortness of breath.   Cardiovascular: Negative for chest pain.  Gastrointestinal: Positive for nausea, vomiting and diarrhea. Negative for abdominal pain.  Genitourinary: Negative for dysuria.  Musculoskeletal: Negative.   Allergic/Immunologic: Negative for immunocompromised state.  Neurological: Positive for dizziness and light-headedness.  Hematological: Does not bruise/bleed easily.  Psychiatric/Behavioral: Negative for confusion.      Allergies  Clindamycin/lincomycin; Bee venom; Latex; Orange concentrate; and Penicillins  Home Medications   Prior to Admission medications   Medication Sig Start Date End Date Taking? Authorizing Provider  busPIRone (BUSPAR) 30 MG tablet Take 1 tablet (30 mg total) by mouth 2 (two) times daily. 10/22/14   Sheliah HatchKatherine E Tabori, MD  cephALEXin (KEFLEX) 500 MG capsule Take 1 capsule (500 mg total) by mouth 4 (four) times  daily. 12/19/14   Layla Maw Ward, DO  EPINEPHrine 0.3 mg/0.3 mL IJ SOAJ injection Inject 0.3 mLs (0.3 mg total) into the muscle once as needed (Use as directed for allergy reaction.). 09/22/14   Sheliah Hatch, MD  norethindrone-ethinyl estradiol (JUNEL FE,GILDESS FE,LOESTRIN FE) 1-20 MG-MCG tablet Take 1 tablet by mouth daily.    Historical Provider, MD  ondansetron (ZOFRAN ODT) 4 MG disintegrating tablet Take 1 tablet (4 mg total) by mouth every 8 (eight) hours as needed for nausea or vomiting. 12/19/14   Kristen N Ward, DO  predniSONE (DELTASONE) 20 MG tablet Take 3  tablets (60 mg total) by mouth daily. 12/19/14   Layla Maw Ward, DO  Prenatal Vit-Fe Fumarate-FA (PRENATAL MULTIVITAMIN) TABS tablet Take 1 tablet by mouth at bedtime.     Historical Provider, MD  topiramate (TOPAMAX) 200 MG tablet Take 1 tablet (200 mg total) by mouth daily. 09/22/14   Sheliah Hatch, MD   Triage Vitals: BP 95/61 mmHg  Pulse 117  Temp(Src) 97.9 F (36.6 C) (Oral)  Resp 16  Ht 5' (1.524 m)  Wt 107 lb (48.535 kg)  BMI 20.90 kg/m2  SpO2 96%  LMP 12/28/2014   Physical Exam  Constitutional: She is oriented to person, place, and time. She appears well-developed and well-nourished. No distress.  HENT:  Head: Normocephalic and atraumatic.  Eyes: Conjunctivae and EOM are normal.  Cardiovascular: Normal rate and regular rhythm.   Pulmonary/Chest: Effort normal and breath sounds normal. No stridor. No respiratory distress.  Abdominal: She exhibits no distension. There is no tenderness. There is no rebound and no guarding.  Musculoskeletal: She exhibits no edema.  Neurological: She is alert and oriented to person, place, and time. No cranial nerve deficit.  Skin: Skin is warm and dry.  Psychiatric: She has a normal mood and affect.  Nursing note and vitals reviewed.   ED Course  Procedures (including critical care time)  DIAGNOSTIC STUDIES: Oxygen Saturation is 96% on RA, adequate by my interpretation.    COORDINATION OF CARE: 1:36 AM-Discussed treatment plan with pt at bedside and pt agreed to plan.     Labs Review Labs Reviewed  CBC WITH DIFFERENTIAL/PLATELET - Abnormal; Notable for the following:    WBC 17.4 (*)    RBC 5.35 (*)    Hemoglobin 16.3 (*)    HCT 48.8 (*)    All other components within normal limits  I-STAT CG4 LACTIC ACID, ED - Abnormal; Notable for the following:    Lactic Acid, Venous 2.23 (*)    All other components within normal limits  CULTURE, BLOOD (ROUTINE X 2)  CULTURE, BLOOD (ROUTINE X 2)  URINE CULTURE  COMPREHENSIVE METABOLIC  PANEL  URINALYSIS, ROUTINE W REFLEX MICROSCOPIC (NOT AT [facility])    Imaging Review Dg Chest 2 View  01/15/2015  CLINICAL DATA:  Acute onset of chills and body aches. Hematemesis. Initial encounter. EXAM: CHEST  2 VIEW COMPARISON:  Chest radiograph performed 12/18/2014 FINDINGS: The lungs are well-aerated and clear. There is no evidence of focal opacification, pleural effusion or pneumothorax. The heart is normal in size; the mediastinal contour is within normal limits. No acute osseous abnormalities are seen. IMPRESSION: No acute cardiopulmonary process seen. Electronically Signed   By: Roanna Raider M.D.   On: 01/15/2015 01:30   I have personally reviewed and evaluated these images and lab results as part of my medical decision-making.  IV fluids, antiemetics initiated.  6:03 AM Patient is ambulatory, continues to complain of nausea,  vomiting.   Repeat lactic acid is elevated compared to prior. Heart rate has decreased appropriately with fluid resuscitation, however. With persistent symptoms, persistent lactic acidosis, patient will be admitted for further evaluation, management.  MDM   Final diagnoses:  Nausea vomiting and diarrhea   previously well female presents with ongoing nausea, vomiting, diarrhea. Interestingly, the patient has no significant abdominal pain, has a soft, nontender abdomen, no evidence for peritonitis. Initially, the patient is clinically dehydrated, and labs are consistent with this, ketonuria, lactic acidosis. No evidence for urinary tract infection or pneumonia. Patient received 3 L of fluid resuscitation, 2 normal saline, 1 D5, half-normal saline. Repeat lactic acidosis was largely elevated, and the patient symptomatically, though her vital signs improved. Given the persistent symptoms, the patient was admitted for further evaluation and management.  I personally performed the services described in this documentation, which was scribed in my presence. The  recorded information has been reviewed and is accurate.      Gerhard Munch, MD 01/15/15 [phone]

## 2015-01-15 NOTE — ED Notes (Signed)
Admitting notified lactic acid should be back shortly and asking if patient can be D/Ced from ED. sts will come down and see patient.

## 2015-01-15 NOTE — ED Notes (Signed)
Lisa Berg from Lab called states lactic acid will results in 15-20 minutes

## 2015-01-15 NOTE — H&P (Signed)
History and Physical  Patient Name: Lisa Berg     ZOX:[id number]    DOB: 03/26/1985    DOA: 01/15/2015 Referring physician: Adalberto Coleob Lockwood, MD PCP: Neena RhymesKatherine Tabori, MD      Chief Complaint: Nausea and vomiting  HPI: Lisa Berg is a 30 y.o. female with a past medical history significant for migraines who presents with nausea and vomiting.  The patient was in her usual state of health until yesterday when she had a typical migraine. She thought nothing of this until last night when she developed recurrent non-bloody emesis. Shortly after she had several episodes of loose stools. It was at that point that she came to the ER. 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 She has no fever, no abdominal pain, and also no cough, dysuria, hematuria.  In the ED, she was afebrile, tachycardic, and with soft blood pressure. Her bicarbonate was low, renal function was normal, LFTs were normal.  She had WBC 17.4 and lactic acid 2.23.  The urinalysis showed casts and ketones but no pyuria or bacteria.  Of note, she was given 2L NS and her lactic acid actually increased to 3.1 mmol/L, so [facility] were asked to admit for observation.     Review of Systems:  Pt complains of headache yesterday, nausea, vomiting, mild cramps from the vomiting. Pt denies any fever, abdominal pain, cough, dysuria, confusion, syncope, difficulty breathing, dizziness.  All other systems negative except as just noted or noted in the history of present illness.  Allergies  Allergen Reactions  . Clindamycin/Lincomycin Hives  . Bee Venom Swelling  . Latex Rash  . Orange Concentrate [Flavoring Agent] Rash  . Penicillins Rash    Prior to Admission medications   Medication Sig Start Date End Date Taking? Authorizing Provider  busPIRone (BUSPAR) 30 MG tablet Take 1 tablet (30 mg  total) by mouth 2 (two) times daily. 10/22/14  Yes Sheliah HatchKatherine E Tabori, MD  EPINEPHrine 0.3 mg/0.3 mL IJ SOAJ injection Inject 0.3 mLs (0.3 mg total) into the muscle once as needed (Use as directed for allergy reaction.). 09/22/14  Yes Sheliah HatchKatherine E Tabori, MD  norethindrone-ethinyl estradiol (JUNEL FE,GILDESS FE,LOESTRIN FE) 1-20 MG-MCG tablet Take 1 tablet by mouth daily.   Yes Historical Provider, MD  ondansetron (ZOFRAN ODT) 4 MG disintegrating tablet Take 1 tablet (4 mg total) by mouth every 8 (eight) hours as needed for nausea or vomiting. 12/19/14  Yes Kristen N Ward, DO  Prenatal Vit-Fe Fumarate-FA (PRENATAL MULTIVITAMIN) TABS tablet Take 1 tablet by mouth at bedtime.    Yes Historical Provider, MD  topiramate (TOPAMAX) 200 MG tablet Take 1 tablet (200 mg total) by mouth daily. 09/22/14  Yes Sheliah HatchKatherine E Tabori, MD    Past Medical History  Diagnosis Date  . Migraine   . Allergy   . Anxiety     Past Surgical History  Procedure Laterality Date  . Wisdom tooth extraction    . Cesarean section N/A 01/03/2013    Procedure: CESAREAN SECTION;  Surgeon: Leslie AndreaJames E Tomblin II, MD;  Location: [facility];  Service: Obstetrics;  Laterality: N/A;    Family history: family history includes Cancer in her mother; Depression in her mother; Diabetes in her maternal grandfather; Heart disease in her father and maternal grandfather; Hyperlipidemia in her father, mother, and sister; Hypertension in her father; Miscarriages /  Stillbirths in her maternal grandmother; Stroke in her maternal aunt. There is no history of Asthma, Birth defects, COPD, Drug abuse, Hearing loss, Kidney disease, or Learning disabilities.  Social History: Patient lives with her husband and 56-year-old son. She works in a daycare. She is not a smoker. She does not use alcohol. She is independent with all ADLs and IADLs.  No recent travel.     Physical Exam: BP 97/55 mmHg  Pulse 96  Temp(Src) 97.9 F (36.6 C) (Oral)  Resp 18  Ht 5'  (1.524 m)  Wt 48.535 kg (107 lb)  BMI 20.90 kg/m2  SpO2 100%  LMP 12/28/2014 General appearance: Thin adult female, alert and in mild distress from anxiety of being admitted.  Otherwise conversational. Eyes: Anicteric, conjunctiva pink, lids and lashes normal.     ENT: No nasal deformity, discharge, or epistaxis.  OP moist without lesions.   Lymph: No cervical lymphadenopathy. Skin: Warm and dry.  No jaundice.  No suspicious rashes or lesions on the face, neck, back, arms, trunk or legs. There is a very faint pink rash on the right hands, which she states has been there months. Cardiac: Tachy regular, nl S1-S2, no murmurs appreciated.  Capillary refill is brisk.   Respiratory: Normal respiratory rate and rhythm.  CTAB without rales or wheezes. Abdomen: Abdomen soft without rigidity.  No HSM.  No TTP at all or guarding. No ascites, distension.   MSK: No deformities or effusions. Neuro: Sensorium intact and responding to questions, attention normal.  Speech is fluent.  Moves all extremities equally and with normal coordination.    Psych: Behavior appropriate.  Affect anxious.  No evidence of aural or visual hallucinations or delusions.       Labs on Admission:  The metabolic panel shows normal sodium, potassium. Serum creatinine is 1.1 which is at her baseline. The bicarbonate is low. The transaminases and bilirubin are normal. Lactic acid level is 2.23 millimoles per liter and increases to 3.12 mmol per liter. Urinalysis shows casts, ketones, but no pyuria or bacteriuria The complete blood count shows leukocytosis.   Radiological Exams on Admission: Personally reviewed: Dg Chest 2 View 01/15/2015 Clear    Assessment/Plan 1. Elevated lactic acid:  This is new.  The patient's LFTs are normal, and decreased clearance is doubted. There is no alcohol history. The patient meets SIRS criteria but is clearly not septic. Lactic acidosis is presumed to be from dehydration and vomiting.    -Continue IVF -Repeat lactic acid at noon  2. Nausea and vomiting:  -Zofran or promethazine when necessary  3. Migraines and anxiety:  -Continue home sertraline and topiramate      DVT PPx: Outpatient status Diet: Regular as tolerated Consultants: None Code Status: Full Family Communication: Husband, present at the bedside  Medical decision making: What exists of the patient's previous chart was reviewed in depth and the case was discussed with Dr. Jeraldine Loots. Patient seen 6:47 AM on 01/15/2015.  Disposition Plan:  Admit as observation. Continue gentle fluids and antiemetics. Repeat lactic acid liter this morning. If resolved lactic acidosis, would be reasonable to discharge home this afternoon. Otherwise workup per serial abdominal exam and reevaluation.      Alberteen Sam [facility] Pager [phone]

## 2015-01-15 NOTE — Discharge Instructions (Signed)
Follow with Primary MD Neena RhymesKatherine Tabori, MD in 7 days   Get CBC, CMP,checked  by Primary MD next visit.     Disposition Home    Diet: Regular diet, patient encouraged to increase fluid intake

## 2015-01-15 NOTE — ED Notes (Signed)
Lockwood, MD at bedside.  

## 2015-01-15 NOTE — Discharge Summary (Signed)
Lisa Berg, is a 30 y.o. female  DOB [birth year]  MRN [id number].  Admission date:  01/15/2015  Admitting Physician  No admitting provider for patient encounter.  Discharge Date:  01/15/2015   Primary MD  Neena Rhymes, MD  Recommendations for primary care physician for things to follow:  - Check CBC, BMP during next visit   Admission Diagnosis  Emesis/Diarrhea   Discharge Diagnosis  Emesis/Diarrhea   Principal Problem:   Elevated lactic acid level Active Problems:   Nausea vomiting and diarrhea      Past Medical History  Diagnosis Date  . Migraine   . Allergy   . Anxiety     Past Surgical History  Procedure Laterality Date  . Wisdom tooth extraction    . Cesarean section N/A 01/03/2013    Procedure: CESAREAN SECTION;  Surgeon: Leslie Andrea, MD;  Location: [facility];  Service: Obstetrics;  Laterality: N/A;       History of present illness and  Hospital Course:     Kindly see H&P for history of present illness and admission details, please review complete Labs, Consult reports and Test reports for all details in brief  HPI  from the history and physical done on the day of admission Lisa Berg is a 30 y.o. female with a past medical history significant for migraines who presents with nausea and vomiting.  The patient was in her usual state of health until yesterday when she had a typical migraine. She thought nothing of this until last night when she developed recurrent non-bloody emesis. Shortly after she had several episodes of loose stools. It was at that point that she came to the ER. 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 She has no fever, no abdominal pain, and also no cough, dysuria, hematuria.  In the ED, she was  afebrile, tachycardic, and with soft blood pressure. Her bicarbonate was low, renal function was normal, LFTs were normal. She had WBC 17.4 and lactic acid 2.23. The urinalysis showed casts and ketones but no pyuria or bacteria.  Of note, she was given 2L NS and her lactic acid actually increased to 3.1 mmol/L, so [facility] were asked to admit for observation.   Hospital Course  1. Elevated lactic acid:  The patient's LFTs are normal,There is no alcohol history. The patient meets SIRS criteria but is clearly not septic. Lactic acidosis is due to dehydration and vomiting,   was aggressively hydrated, was on IV normal saline 1 50 mL/h, peak lactic acid is 1.7, denies any further vomiting, reports mild nausea, a shunt will be able to go home and was encouraged to increase her fluid intake, patient is afebrile, leukocytosis most likely is related to nausea and vomiting.  2. Nausea and vomiting:  - Secondary to gastroenteritis, likely viral, patient was able to tolerate by mouth intake with no  vomiting, reports mild nausea, be discharged on Zofran  3. Migraines and anxiety:  -Continue home sertraline and topiramate      Discharge Condition: stable   Follow UP  Follow-up Information    Follow up with Neena RhymesKatherine Tabori, MD. Schedule an appointment as soon as possible for a visit in 1 week.   Specialty:  Family Medicine   Why:  Posthospitalization follow-up   Contact information:   [address] [place] [id number] [phone]         Discharge Instructions  and  Discharge Medications     Discharge Instructions    Discharge instructions    Complete by:  As directed   Follow with Primary MD Neena RhymesKatherine Tabori, MD in 7 days   Get CBC, CMP,checked  by Primary MD next visit.     Disposition Home    Diet: Regular diet, patient encouraged to increase fluid intake     Increase activity slowly    Complete by:  As directed             Medication List      TAKE these medications        busPIRone 30 MG tablet  Commonly known as:  BUSPAR  Take 1 tablet (30 mg total) by mouth 2 (two) times daily.     EPINEPHrine 0.3 mg/0.3 mL Soaj injection  Commonly known as:  EPI-PEN  Inject 0.3 mLs (0.3 mg total) into the muscle once as needed (Use as directed for allergy reaction.).     norethindrone-ethinyl estradiol 1-20 MG-MCG tablet  Commonly known as:  JUNEL FE,GILDESS FE,LOESTRIN FE  Take 1 tablet by mouth daily.     ondansetron 4 MG disintegrating tablet  Commonly known as:  ZOFRAN ODT  Take 1 tablet (4 mg total) by mouth every 8 (eight) hours as needed for nausea or vomiting.     prenatal multivitamin Tabs tablet  Take 1 tablet by mouth at bedtime.     topiramate 200 MG tablet  Commonly known as:  TOPAMAX  Take 1 tablet (200 mg total) by mouth daily.          Diet and Activity recommendation: See Discharge Instructions above   Consults obtained -  none   Major procedures and Radiology Reports - PLEASE review detailed and final reports for all details, in brief -    Dg Chest 2 View  01/15/2015  CLINICAL DATA:  Acute onset of chills and body aches. Hematemesis. Initial encounter. EXAM: CHEST  2 VIEW COMPARISON:  Chest radiograph performed 12/18/2014 FINDINGS: The lungs are well-aerated and clear. There is no evidence of focal opacification, pleural effusion or pneumothorax. The heart is normal in size; the mediastinal contour is within normal limits. No acute osseous abnormalities are seen. IMPRESSION: No acute cardiopulmonary process seen. Electronically Signed   By: Roanna RaiderJeffery  Chang M.D.   On: 01/15/2015 01:30   Dg Chest 2 View  12/19/2014  CLINICAL DATA:  Chest pain.  Question allergic reaction. EXAM: CHEST  2 VIEW COMPARISON:  None. FINDINGS: The cardiomediastinal contours are normal. The lungs are clear. Pulmonary vasculature is normal. No consolidation, pleural effusion, or pneumothorax. No acute osseous abnormalities are seen.  IMPRESSION: No acute pulmonary process. Electronically Signed   By: Rubye OaksMelanie  Ehinger M.D.   On: 12/19/2014 00:14    Micro Results     No results found for this or any previous visit (from the past 240 hour(s)).     Today   Subjective:  Lisa Berg today has no headache,no chest pain,no new weakness tingling or numbness, reports mild nausea , tolerating oral intake with no vomiting ,feels much better wants to go home today.  Objective:   Blood pressure 99/60, pulse 83, temperature 97.9 F (36.6 C), temperature source Oral, resp. rate 16, height 5' (1.524 m), weight 48.535 kg (107 lb), last menstrual period 12/28/2014, SpO2 100 %.   Intake/Output Summary (Last 24 hours) at 01/15/15 1415 Last data filed at 01/15/15 0556  Gross per 24 hour  Intake   3000 ml  Output      0 ml  Net   3000 ml    Exam Awake Alert, Oriented x 3, No new F.N deficits, Normal affect [place].AT,PERRAL Supple Neck,No JVD, No cervical lymphadenopathy appriciated.  Symmetrical Chest wall movement, Good air movement bilaterally, CTAB RRR,No Gallops,Rubs or new Murmurs, No Parasternal Heave +ve B.Sounds, Abd Soft, Non tender, No organomegaly appriciated, No rebound -guarding or rigidity. No Cyanosis, Clubbing or edema, No new Rash or bruise  Data Review   CBC w Diff: Lab Results  Component Value Date   WBC 17.4* 01/15/2015   HGB 16.3* 01/15/2015   HCT 48.8* 01/15/2015   PLT 225 01/15/2015   LYMPHOPCT 9 01/15/2015   MONOPCT 0 01/15/2015   EOSPCT 0 01/15/2015   BASOPCT 0 01/15/2015    CMP: Lab Results  Component Value Date   NA 141 01/15/2015   K 4.1 01/15/2015   CL 112* 01/15/2015   CO2 16* 01/15/2015   BUN 19 01/15/2015   CREATININE 1.10* 01/15/2015   PROT 8.2* 01/15/2015   ALBUMIN 4.5 01/15/2015   BILITOT 0.7 01/15/2015   ALKPHOS 42 01/15/2015   AST 22 01/15/2015   ALT 16 01/15/2015  .   Total Time in preparing paper work, data evaluation and todays exam - 25 minutes  Lisa Berg,  Lisa Berg M.D on 01/15/2015 at 2:15 PM  [facility]   Office  [phone]

## 2015-01-16 LAB — URINE CULTURE

## 2015-01-20 ENCOUNTER — Telehealth: Payer: Self-pay

## 2015-01-20 LAB — CULTURE, BLOOD (ROUTINE X 2)
CULTURE: NO GROWTH
Culture: NO GROWTH

## 2015-01-20 NOTE — Telephone Encounter (Signed)
Pt notified and made aware.  No further concerns voiced at this time.

## 2015-01-20 NOTE — Telephone Encounter (Signed)
Left message for call back.

## 2015-01-20 NOTE — Telephone Encounter (Signed)
Pt called back . Best # [phone]

## 2015-01-20 NOTE — Telephone Encounter (Signed)
Pt currently at work.  States she feels much better.  No longer having symptoms (n/v/d).  No new symptoms.   It turns out patient was admitted, but kept in the ER.    Does she still need to follow up with you?  Please advise.

## 2015-01-20 NOTE — Telephone Encounter (Signed)
No need to f/u if feeling better

## 2015-02-18 ENCOUNTER — Other Ambulatory Visit: Payer: Self-pay | Admitting: Family Medicine

## 2015-02-19 ENCOUNTER — Telehealth: Payer: Self-pay | Admitting: Behavioral Health

## 2015-02-19 ENCOUNTER — Encounter: Payer: Self-pay | Admitting: Family Medicine

## 2015-02-19 ENCOUNTER — Encounter: Payer: Self-pay | Admitting: Behavioral Health

## 2015-02-19 ENCOUNTER — Ambulatory Visit (INDEPENDENT_AMBULATORY_CARE_PROVIDER_SITE_OTHER): Payer: Managed Care, Other (non HMO) | Admitting: Family Medicine

## 2015-02-19 VITALS — BP 98/70 | HR 95 | Temp 98.3°F | Ht 60.0 in | Wt 112.8 lb

## 2015-02-19 DIAGNOSIS — N926 Irregular menstruation, unspecified: Secondary | ICD-10-CM

## 2015-02-19 DIAGNOSIS — Z0000 Encounter for general adult medical examination without abnormal findings: Secondary | ICD-10-CM | POA: Diagnosis not present

## 2015-02-19 DIAGNOSIS — N910 Primary amenorrhea: Secondary | ICD-10-CM | POA: Diagnosis not present

## 2015-02-19 LAB — HEPATIC FUNCTION PANEL
ALK PHOS: 42 U/L (ref 39–117)
ALT: 10 U/L (ref 0–35)
AST: 13 U/L (ref 0–37)
Albumin: 4.6 g/dL (ref 3.5–5.2)
BILIRUBIN DIRECT: 0.1 mg/dL (ref 0.0–0.3)
TOTAL PROTEIN: 8 g/dL (ref 6.0–8.3)
Total Bilirubin: 0.5 mg/dL (ref 0.2–1.2)

## 2015-02-19 LAB — LIPID PANEL
CHOLESTEROL: 209 mg/dL — AB (ref 0–200)
HDL: 62 mg/dL (ref >39.00)
LDL Cholesterol: 128 mg/dL — ABNORMAL HIGH (ref 0–99)
NonHDL: 146.59
TRIGLYCERIDES: 94 mg/dL (ref 0.0–149.0)
Total CHOL/HDL Ratio: 3
VLDL: 18.8 mg/dL (ref 0.0–40.0)

## 2015-02-19 LAB — BASIC METABOLIC PANEL
BUN: 9 mg/dL (ref 6–23)
CO2: 23 meq/L (ref 19–32)
Calcium: 9.7 mg/dL (ref 8.4–10.5)
Chloride: 109 mEq/L (ref 96–112)
Creatinine, Ser: 0.7 mg/dL (ref 0.40–1.20)
GFR: 105.76 mL/min (ref >60.00)
GLUCOSE: 82 mg/dL (ref 70–99)
POTASSIUM: 3.2 meq/L — AB (ref 3.5–5.1)
SODIUM: 140 meq/L (ref 135–145)

## 2015-02-19 LAB — TSH: TSH: 1.3 u[IU]/mL (ref 0.35–4.50)

## 2015-02-19 LAB — VITAMIN D 25 HYDROXY (VIT D DEFICIENCY, FRACTURES): VITD: 30.44 ng/mL (ref 30.00–100.00)

## 2015-02-19 LAB — POCT URINE PREGNANCY: Preg Test, Ur: NEGATIVE

## 2015-02-19 MED ORDER — TOPIRAMATE 200 MG PO TABS: 200.0000 mg | ORAL_TABLET | Freq: Every day | ORAL | Status: DC

## 2015-02-19 NOTE — Assessment & Plan Note (Signed)
Pt's PE WNL.  UTD on GYN.  Again encouraged pt to seek counseling for her anxiety- pt declined.  Check labs.  Anticipatory guidance provided.

## 2015-02-19 NOTE — Telephone Encounter (Signed)
Pre-Visit Call completed with patient and chart updated.   Pre-Visit Info documented in Specialty Comments under [employer].    

## 2015-02-19 NOTE — Telephone Encounter (Signed)
Medication filled to pharmacy as requested.   

## 2015-02-19 NOTE — Progress Notes (Signed)
Pre visit review using our clinic review tool, if applicable. No additional management support is needed unless otherwise documented below in the visit note. 

## 2015-02-19 NOTE — Progress Notes (Signed)
   Subjective:    Patient ID: Lisa Berg, female    DOB: 01-20-1985, 30 y.o.   MRN: [id number]  HPI CPE- denies physical concern but is struggling again w/ anxiety   Review of Systems Patient reports no vision/ hearing changes, adenopathy,fever, weight change,  persistant/recurrent hoarseness , swallowing issues, chest pain, palpitations, edema, persistant/recurrent cough, hemoptysis, dyspnea (rest/exertional/paroxysmal nocturnal), gastrointestinal bleeding (melena, rectal bleeding), abdominal pain, significant heartburn, bowel changes, GU symptoms (dysuria, hematuria, incontinence), Gyn symptoms (abnormal  bleeding, pain),  syncope, focal weakness, memory loss, numbness & tingling, skin/hair/nail changes, abnormal bruising or bleeding.     Objective:   Physical Exam General Appearance:    Alert, cooperative, no distress, appears stated age  Head:    Normocephalic, without obvious abnormality, atraumatic  Eyes:    PERRL, conjunctiva/corneas clear, EOM's intact, fundi    benign, both eyes  Ears:    Normal TM's and external ear canals, both ears  Nose:   Nares normal, septum midline, mucosa normal, no drainage    or sinus tenderness  Throat:   Lips, mucosa, and tongue normal; teeth and gums normal  Neck:   Supple, symmetrical, trachea midline, no adenopathy;    Thyroid: no enlargement/tenderness/nodules  Back:     Symmetric, no curvature, ROM normal, no CVA tenderness  Lungs:     Clear to auscultation bilaterally, respirations unlabored  Chest Wall:    No tenderness or deformity   Heart:    Regular rate and rhythm, S1 and S2 normal, no murmur, rub   or gallop  Breast Exam:    Deferred to GYN  Abdomen:     Soft, non-tender, bowel sounds active all four quadrants,    no masses, no organomegaly  Genitalia:    Deferred to GYN  Rectal:    Extremities:   Extremities normal, atraumatic, no cyanosis or edema  Pulses:   2+ and symmetric all extremities  Skin:   Skin color, texture, turgor  normal, no rashes or lesions  Lymph nodes:   Cervical, supraclavicular, and axillary nodes normal  Neurologic:   CNII-XII intact, normal strength, sensation and reflexes    throughout          Assessment & Plan:

## 2015-02-19 NOTE — Patient Instructions (Signed)
Follow up in 1 year or as needed We'll notify you of your lab results and make any changes if needed Try and find a stress outlet- you deserve it! Call with any questions or concerns Hang in there!!!

## 2015-02-21 LAB — CBC WITH DIFFERENTIAL/PLATELET
BASOS ABS: 0 10*3/uL (ref 0.0–0.1)
Basophils Relative: 0.3 % (ref 0.0–3.0)
EOS ABS: 0.1 10*3/uL (ref 0.0–0.7)
Eosinophils Relative: 0.8 % (ref 0.0–5.0)
HEMATOCRIT: 47.7 % — AB (ref 36.0–46.0)
HEMOGLOBIN: 14.6 g/dL (ref 12.0–15.0)
LYMPHS ABS: 1.8 10*3/uL (ref 0.7–4.0)
Lymphocytes Relative: 15.3 % (ref 12.0–46.0)
MCHC: 30.6 g/dL (ref 30.0–36.0)
MCV: 99.6 fl (ref 78.0–100.0)
MONO ABS: 0.6 10*3/uL (ref 0.1–1.0)
Monocytes Relative: 5.5 % (ref 3.0–12.0)
NEUTROS ABS: 9.1 10*3/uL — AB (ref 1.4–7.7)
NEUTROS PCT: 78.1 % — AB (ref 43.0–77.0)
Platelets: 281 10*3/uL (ref 150.0–400.0)
RBC: 4.79 Mil/uL (ref 3.87–5.11)
RDW: 14.3 % (ref 11.5–15.5)
WBC: 11.7 10*3/uL — ABNORMAL HIGH (ref 4.0–10.5)

## 2015-02-22 ENCOUNTER — Encounter: Payer: Self-pay | Admitting: General Practice

## 2015-06-11 ENCOUNTER — Other Ambulatory Visit: Payer: Self-pay | Admitting: General Practice

## 2015-06-11 MED ORDER — BUSPIRONE HCL 30 MG PO TABS: 30.0000 mg | ORAL_TABLET | Freq: Two times a day (BID) | ORAL | Status: DC

## 2015-10-15 ENCOUNTER — Other Ambulatory Visit: Payer: Self-pay | Admitting: Family Medicine

## 2016-02-12 ENCOUNTER — Other Ambulatory Visit: Payer: Self-pay | Admitting: Family Medicine

## 2016-02-22 ENCOUNTER — Encounter: Payer: Managed Care, Other (non HMO) | Admitting: Family Medicine

## 2016-06-13 ENCOUNTER — Other Ambulatory Visit: Payer: Self-pay | Admitting: Family Medicine

## 2016-06-14 ENCOUNTER — Other Ambulatory Visit: Payer: Self-pay | Admitting: Family Medicine

## 2016-07-07 ENCOUNTER — Other Ambulatory Visit: Payer: Self-pay | Admitting: Family Medicine

## 2016-07-10 ENCOUNTER — Other Ambulatory Visit: Payer: Self-pay | Admitting: Family Medicine

## 2016-07-10 NOTE — Telephone Encounter (Signed)
Pt has made an appt for next week and asking if this Rx could be called in for her now.

## 2016-07-10 NOTE — Telephone Encounter (Signed)
Med filled, as pt has an appt for next week.

## 2016-07-20 ENCOUNTER — Encounter: Payer: Self-pay | Admitting: General Practice

## 2016-07-20 ENCOUNTER — Ambulatory Visit (INDEPENDENT_AMBULATORY_CARE_PROVIDER_SITE_OTHER): Payer: BLUE CROSS/BLUE SHIELD | Admitting: Family Medicine

## 2016-07-20 ENCOUNTER — Encounter: Payer: Self-pay | Admitting: Family Medicine

## 2016-07-20 VITALS — BP 106/68 | HR 85 | Temp 98.8°F | Resp 16 | Ht 60.0 in | Wt 145.0 lb

## 2016-07-20 DIAGNOSIS — R635 Abnormal weight gain: Secondary | ICD-10-CM | POA: Diagnosis not present

## 2016-07-20 DIAGNOSIS — Z0000 Encounter for general adult medical examination without abnormal findings: Secondary | ICD-10-CM | POA: Diagnosis not present

## 2016-07-20 LAB — BASIC METABOLIC PANEL
BUN: 10 mg/dL (ref 6–23)
CHLORIDE: 102 meq/L (ref 96–112)
CO2: 31 meq/L (ref 19–32)
CREATININE: 0.66 mg/dL (ref 0.40–1.20)
Calcium: 9.8 mg/dL (ref 8.4–10.5)
GFR: 112.07 mL/min (ref >60.00)
Glucose, Bld: 79 mg/dL (ref 70–99)
POTASSIUM: 4 meq/L (ref 3.5–5.1)
SODIUM: 140 meq/L (ref 135–145)

## 2016-07-20 LAB — LIPID PANEL
Cholesterol: 227 mg/dL — ABNORMAL HIGH (ref 0–200)
HDL: 69 mg/dL (ref >39.00)
LDL Cholesterol: 140 mg/dL — ABNORMAL HIGH (ref 0–99)
NONHDL: 157.57
Total CHOL/HDL Ratio: 3
Triglycerides: 88 mg/dL (ref 0.0–149.0)
VLDL: 17.6 mg/dL (ref 0.0–40.0)

## 2016-07-20 LAB — CBC WITH DIFFERENTIAL/PLATELET
BASOS ABS: 0.1 10*3/uL (ref 0.0–0.1)
BASOS PCT: 0.6 % (ref 0.0–3.0)
EOS ABS: 0.2 10*3/uL (ref 0.0–0.7)
Eosinophils Relative: 2.2 % (ref 0.0–5.0)
HCT: 40.8 % (ref 36.0–46.0)
Hemoglobin: 13.9 g/dL (ref 12.0–15.0)
LYMPHS ABS: 2.8 10*3/uL (ref 0.7–4.0)
Lymphocytes Relative: 28.8 % (ref 12.0–46.0)
MCHC: 34.1 g/dL (ref 30.0–36.0)
MCV: 88.4 fl (ref 78.0–100.0)
MONO ABS: 0.7 10*3/uL (ref 0.1–1.0)
Monocytes Relative: 6.7 % (ref 3.0–12.0)
NEUTROS ABS: 6.1 10*3/uL (ref 1.4–7.7)
NEUTROS PCT: 61.7 % (ref 43.0–77.0)
PLATELETS: 277 10*3/uL (ref 150.0–400.0)
RBC: 4.62 Mil/uL (ref 3.87–5.11)
RDW: 13.2 % (ref 11.5–15.5)
WBC: 9.9 10*3/uL (ref 4.0–10.5)

## 2016-07-20 LAB — HEPATIC FUNCTION PANEL
ALK PHOS: 57 U/L (ref 39–117)
ALT: 8 U/L (ref 0–35)
AST: 12 U/L (ref 0–37)
Albumin: 4.3 g/dL (ref 3.5–5.2)
BILIRUBIN DIRECT: 0.1 mg/dL (ref 0.0–0.3)
BILIRUBIN TOTAL: 0.4 mg/dL (ref 0.2–1.2)
TOTAL PROTEIN: 7.4 g/dL (ref 6.0–8.3)

## 2016-07-20 LAB — TSH: TSH: 3.21 u[IU]/mL (ref 0.35–4.50)

## 2016-07-20 MED ORDER — BUSPIRONE HCL 30 MG PO TABS: ORAL_TABLET | ORAL | 6 refills | Status: DC

## 2016-07-20 MED ORDER — TOPIRAMATE 200 MG PO TABS: 200.0000 mg | ORAL_TABLET | Freq: Every day | ORAL | 3 refills | Status: DC

## 2016-07-20 NOTE — Assessment & Plan Note (Signed)
Pt's PE WNL w/ exception of obesity.  UTD on GYN.  Check labs.  Anticipatory guidance provided.  

## 2016-07-20 NOTE — Patient Instructions (Signed)
Follow up in 1 year or as needed We'll notify you of your lab results and make any changes if needed Continue to work on healthy diet and regular exercise Continue the Buspar twice daily Restart the Topamax Call with any questions or concerns Hang in there!!  You've got this!

## 2016-07-20 NOTE — Progress Notes (Signed)
   Subjective:    Patient ID: Lisa Berg, female    DOB: 12/13/1985, 31 y.o.   MRN: [id number]  HPI CPE- pt is tearful about lack of pregnancy after 1 yr.  Asking to restart Topamax due to increased frequency of HAs.   Review of Systems Patient reports no vision/ hearing changes, adenopathy,fever, weight change,  persistant/recurrent hoarseness , swallowing issues, chest pain, palpitations, edema, persistant/recurrent cough, hemoptysis, dyspnea (rest/exertional/paroxysmal nocturnal), gastrointestinal bleeding (melena, rectal bleeding), abdominal pain, significant heartburn, bowel changes, GU symptoms (dysuria, hematuria, incontinence), Gyn symptoms (abnormal  bleeding, pain),  syncope, focal weakness, memory loss, numbness & tingling, skin/hair/nail changes, abnormal bruising or bleeding, anxiety, or depression.     Objective:   Physical Exam General Appearance:    Alert, cooperative, no distress, appears stated age  Head:    Normocephalic, without obvious abnormality, atraumatic  Eyes:    PERRL, conjunctiva/corneas clear, EOM's intact, fundi    benign, both eyes  Ears:    Normal TM's and external ear canals, both ears  Nose:   Nares normal, septum midline, mucosa normal, no drainage    or sinus tenderness  Throat:   Lips, mucosa, and tongue normal; teeth and gums normal  Neck:   Supple, symmetrical, trachea midline, no adenopathy;    Thyroid: no enlargement/tenderness/nodules  Back:     Symmetric, no curvature, ROM normal, no CVA tenderness  Lungs:     Clear to auscultation bilaterally, respirations unlabored  Chest Wall:    No tenderness or deformity   Heart:    Regular rate and rhythm, S1 and S2 normal, no murmur, rub   or gallop  Breast Exam:    Deferred to GYN  Abdomen:     Soft, non-tender, bowel sounds active all four quadrants,    no masses, no organomegaly  Genitalia:    Deferred to GYN  Rectal:    Extremities:   Extremities normal, atraumatic, no cyanosis or edema    Pulses:   2+ and symmetric all extremities  Skin:   Skin color, texture, turgor normal, no rashes or lesions  Lymph nodes:   Cervical, supraclavicular, and axillary nodes normal  Neurologic:   CNII-XII intact, normal strength, sensation and reflexes    throughout          Assessment & Plan:

## 2016-07-20 NOTE — Progress Notes (Signed)
Pre visit review using our clinic review tool, if applicable. No additional management support is needed unless otherwise documented below in the visit note. 

## 2016-07-20 NOTE — Assessment & Plan Note (Signed)
Pt has gained 32 lbs since last visit.  Stressed need for healthy diet and regular exercise.  Check labs.  Will follow.

## 2016-11-14 ENCOUNTER — Telehealth: Payer: Self-pay | Admitting: General Practice

## 2016-11-14 ENCOUNTER — Other Ambulatory Visit: Payer: Self-pay | Admitting: Family Medicine

## 2016-11-14 NOTE — Telephone Encounter (Signed)
Pt returned call, advised that she had TB completed at urgent care. Pt will have record of TB test sent to our office.

## 2016-11-14 NOTE — Telephone Encounter (Signed)
Called pt and left a detailed message on voicemail to advise that form could not be completed. Per PCP patient would either need a lab visit for a quantiferon blood draw or a nurse visit for a TB skin placement. paperwork is in Marleni Gallardo's folder until one of these has been completed.

## 2016-11-28 IMAGING — CR DG CHEST 2V
2 series · 2 of 2 positions shown · non-contrast
Comparison: None.

CLINICAL DATA: Chest pain.  Question allergic reaction.

EXAM:
CHEST  2 VIEW

[chest pa]
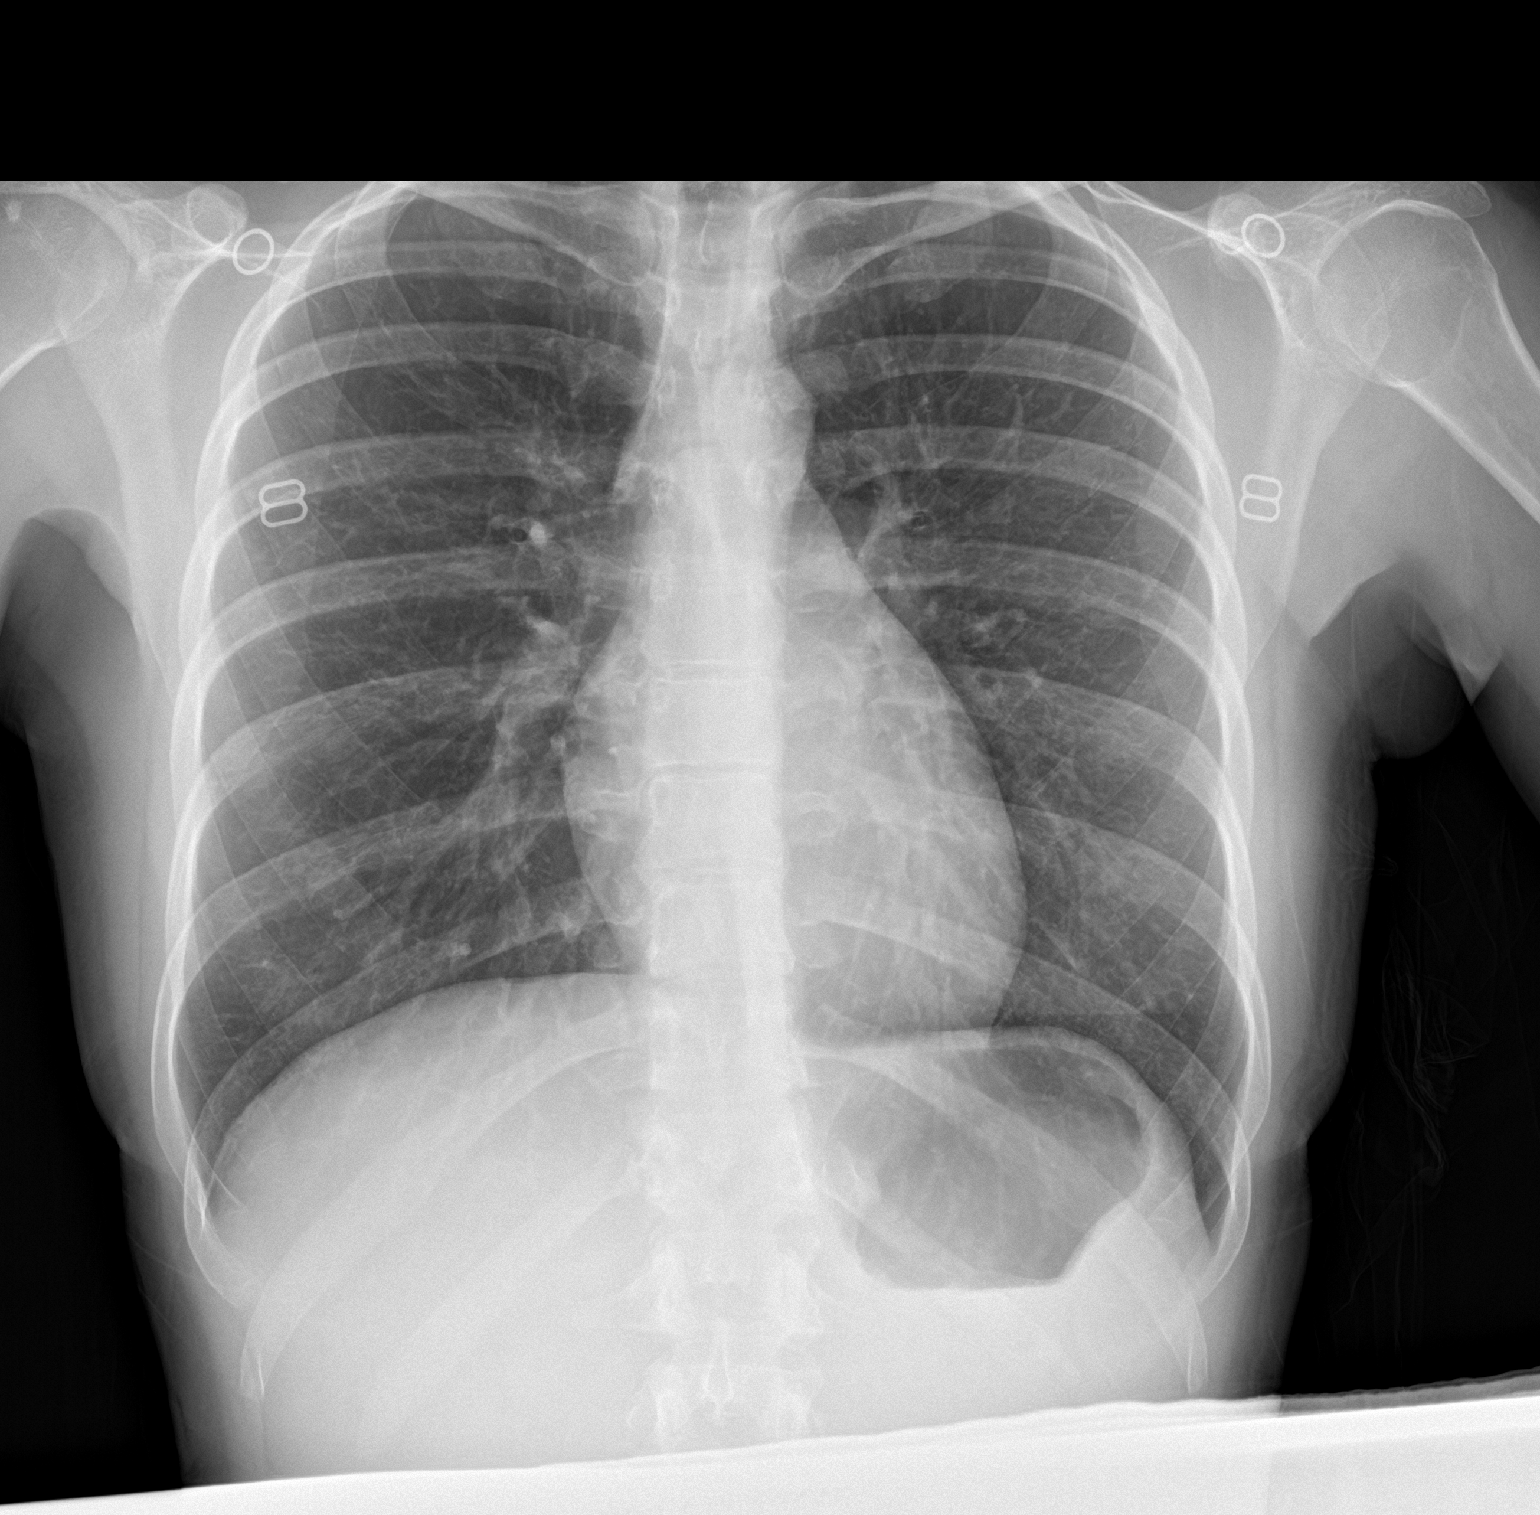

[chest lat]
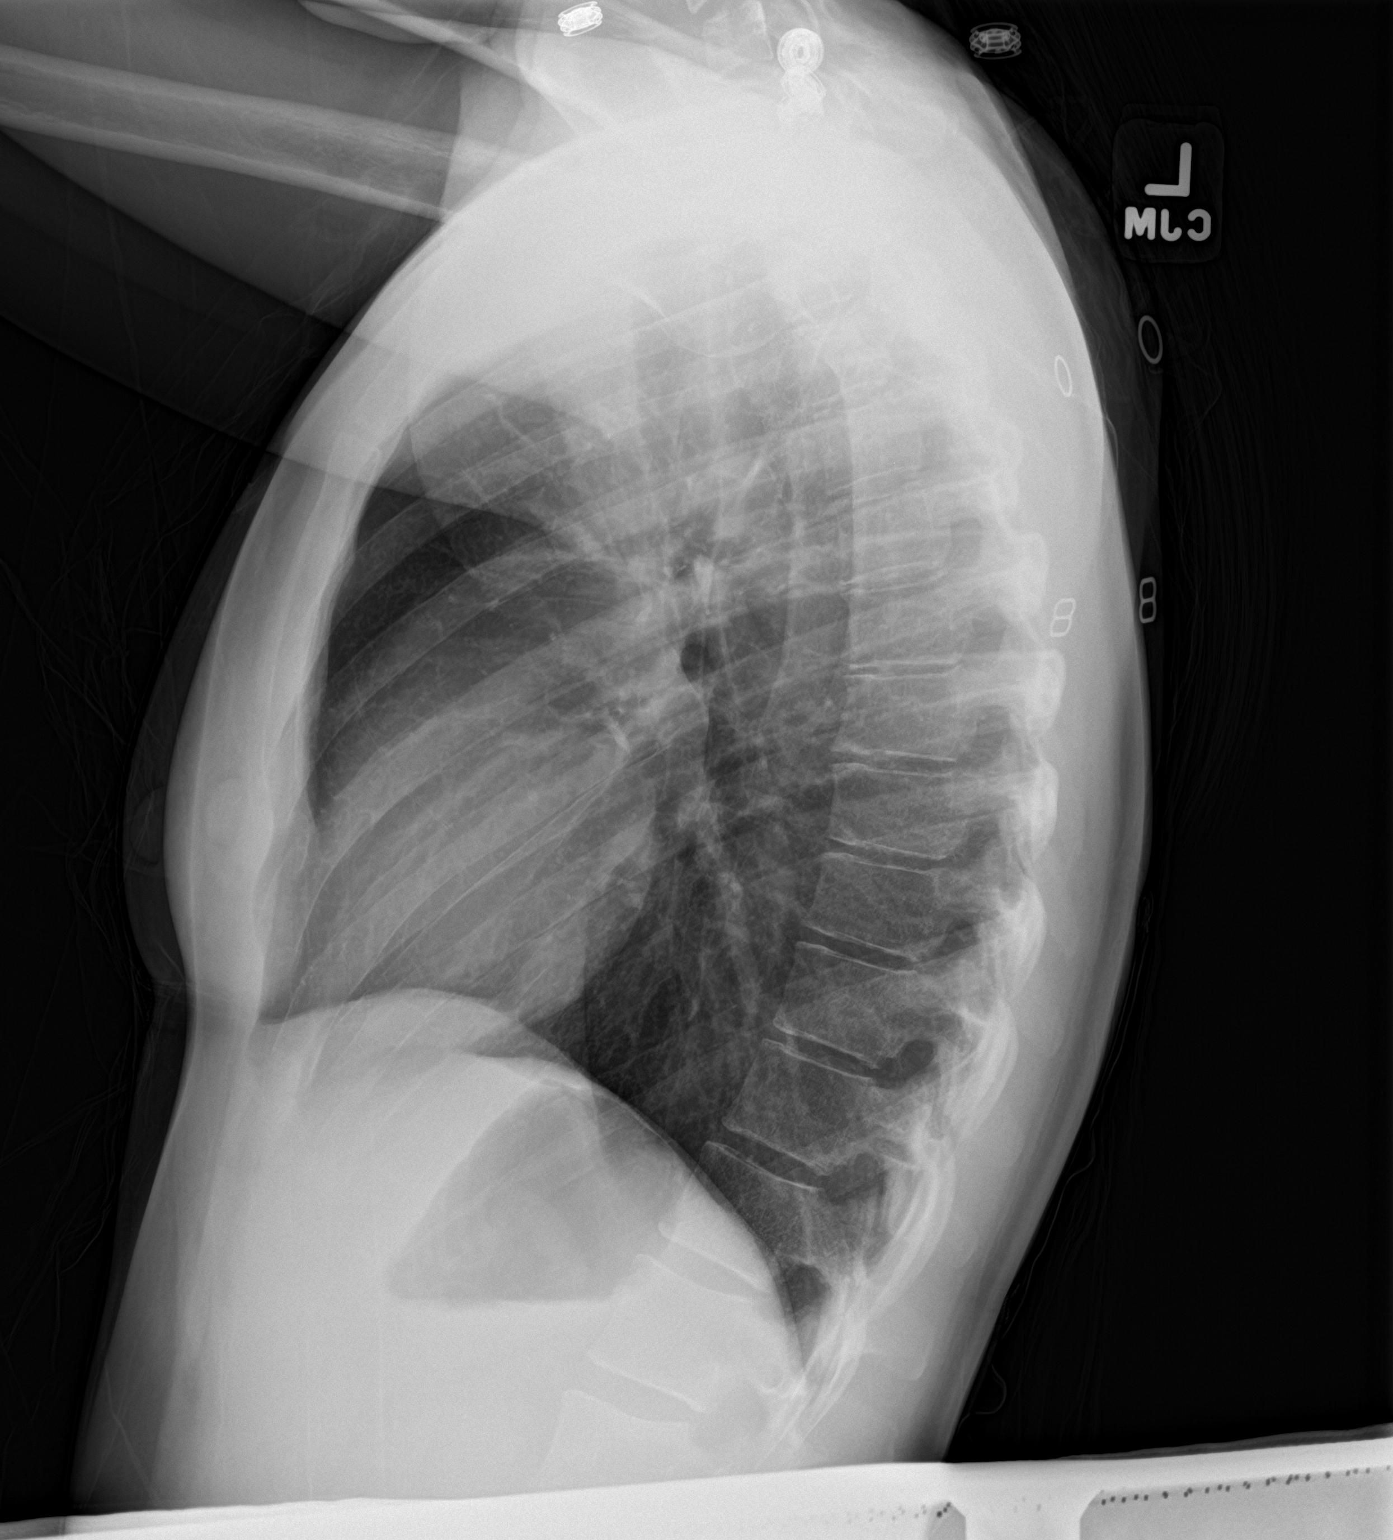

[2 of 2 positions shown; findings below may reference images not displayed]

FINDINGS: The cardiomediastinal contours are normal. The lungs are clear.
Pulmonary vasculature is normal. No consolidation, pleural effusion,
or pneumothorax. No acute osseous abnormalities are seen.
IMPRESSION: No acute pulmonary process.

## 2016-12-05 ENCOUNTER — Ambulatory Visit: Payer: BLUE CROSS/BLUE SHIELD | Admitting: Family Medicine

## 2016-12-05 ENCOUNTER — Encounter: Payer: Self-pay | Admitting: Family Medicine

## 2016-12-05 ENCOUNTER — Encounter: Payer: Self-pay | Admitting: *Deleted

## 2016-12-05 VITALS — BP 112/68 | HR 109 | Temp 100.1°F | Ht 60.0 in | Wt 133.4 lb

## 2016-12-05 DIAGNOSIS — R509 Fever, unspecified: Secondary | ICD-10-CM

## 2016-12-05 DIAGNOSIS — B349 Viral infection, unspecified: Secondary | ICD-10-CM

## 2016-12-05 LAB — POCT INFLUENZA A/B
Influenza A, POC: NEGATIVE
Influenza B, POC: NEGATIVE

## 2016-12-05 NOTE — Progress Notes (Signed)
Subjective   CC:  Chief Complaint  Patient presents with  . Cough  . Nasal Congestion  . Generalized Body Aches  . Headache  . Chills  . Sore Throat    HPI: Lisa Berg is a 31 y.o. female who presents to the office today to address the problems listed above in the chief complaint.  Patient complains of typical URI symptoms including nasal congestion, mild sore throat, cough, myaglias and mild malaise.  The symptoms have been present for several days but have worsened over the last 24 hours.Shedenies high fever or productive cough shortness of breath or significant GI symptoms.  Over-the-counter cold medicines have been minimally helpful. She denies dysuria, flank pain. 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  I reviewed the patients updated PMH, FH, and SocHx.    Patient Active Problem List   Diagnosis Date Noted  . Weight gain 07/20/2016  . Physical exam 02/19/2015  . Menstrual period late 10/22/2014  . Generalized anxiety disorder 09/22/2014  . Myalgia and myositis 11/21/2013  . Internal hemorrhoid 04/14/2013  . Preterm labor 12/07/2012  . 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 OTHER VITAMIN B12 DEFICIENCY ANEMIA 04/16/2009  . CONTACT DERMATITIS&OTHER ECZEMA DUE UNSPEC CAUSE 04/16/2009  . SINUSITIS - ACUTE-NOS 04/08/2009  . NUMBNESS 02/19/2009  . MONONUCLEOSIS 11/04/2008  . ACUTE PHARYNGITIS 11/04/2008  . Migraine 04/03/2008   Current Meds  Medication Sig  . busPIRone (BUSPAR) 30 MG tablet TAKE 1 TABLET BY MOUTH TWICE DAILY.  Marland Kitchen. Docusate Calcium (STOOL SOFTENER PO) Take by mouth at bedtime as needed.  Marland Kitchen. EPINEPHrine 0.3 mg/0.3 mL IJ SOAJ injection Inject 0.3 mLs (0.3 mg total) into the muscle once as needed (Use as directed for allergy reaction.).  Marland Kitchen. fexofenadine (ALLEGRA) 30 MG tablet Take 30 mg by mouth at bedtime as needed.  . Prenatal Vit-Fe Fumarate-FA (PRENATAL MULTIVITAMIN) TABS tablet Take 1 tablet by mouth at bedtime.   . topiramate  (TOPAMAX) 200 MG tablet TAKE 1 TABLET BY MOUTH ONCE DAILY    Review of Systems: Constitutional: Negative for fever >100 Cardiovascular: negative for chest pain Respiratory: negative for SOB or pleuritic chest pain Gastrointestinal: negative for abdominal pain  Objective  Vitals: BP 112/68 (BP Location: Left Arm, Patient Position: Sitting, Cuff Size: Normal)   Pulse (!) 109   Temp 100.1 F (37.8 C) (Oral)   Ht 5' (1.524 m)   Wt 133 lb 6 oz (60.5 kg)   LMP 11/22/2016   SpO2 99%   BMI 26.05 kg/m  General: no acute respiratory distress , appears well Psych:  Alert and oriented, normal mood and affect HEENT: Normocephalic, nasal congestion present, TMs w/o erythema, OP with erythema w/o exudate, + cervical LAD, supple neck  Cardiovascular:  RRR without murmur or gallop. no peripheral edema Respiratory:  Good breath sounds bilaterally, CTAB with normal respiratory effort Skin:  Warm, no rashes Neurologic:   Mental status is normal. normal gait   Assessment  1. Viral syndrome   2. Fever, unspecified fever cause      Plan   Viral syndrome: Patient's symptoms are consistent with a viral infection.  Influenza is negative and she has not had high-grade fever or significant cough.  No signs or symptoms of serious bacterial infection are present.  Recommend supportive care.  See after visit summary for written instructions.  NSAIDs for myalgias, good hydration orally, and rest.  Work note until fever and myalgias resolve was given.  Follow up: Return if symptoms worsen or fail to improve.    Commons side effects, risks, benefits, and alternatives for medications and treatment plan prescribed today were discussed, and the patient expressed understanding of the given instructions. Patient is instructed to call or message via MyChart if he/she has any questions or concerns regarding our treatment plan. No barriers to understanding were identified. We discussed Red Flag symptoms and signs in  detail. Patient expressed understanding regarding what to do in case of urgent or emergency type symptoms.   Medication list was reconciled, printed and provided to the patient in AVS. Patient instructions and summary information was reviewed with the patient as documented in the AVS. This note was prepared with assistance of Dragon voice recognition software. Occasional wrong-word or sound-a-like substitutions may have occurred due to the inherent limitations of voice recognition software  Orders Placed This Encounter  Procedures  . POCT Influenza A/B   No orders of the defined types were placed in this encounter.

## 2016-12-05 NOTE — Patient Instructions (Signed)
Viral Illness, Adult Viruses are tiny germs that can get into a person's body and cause illness. There are many different types of viruses, and they cause many types of illness. Viral illnesses can range from mild to severe. They can affect various parts of the body. Common illnesses that are caused by a virus include colds and the flu. Viral illnesses also include serious conditions such as HIV/AIDS (human immunodeficiency virus/acquired immunodeficiency syndrome). A few viruses have been linked to certain cancers. What are the causes? Many types of viruses can cause illness. Viruses invade cells in your body, multiply, and cause the infected cells to malfunction or die. When the cell dies, it releases more of the virus. When this happens, you develop symptoms of the illness, and the virus continues to spread to other cells. If the virus takes over the function of the cell, it can cause the cell to divide and grow out of control, as is the case when a virus causes cancer. Different viruses get into the body in different ways. You can get a virus by:  Swallowing food or water that is contaminated with the virus.  Breathing in droplets that have been coughed or sneezed into the air by an infected person.  Touching a surface that has been contaminated with the virus and then touching your eyes, nose, or mouth.  Being bitten by an insect or animal that carries the virus.  Having sexual contact with a person who is infected with the virus.  Being exposed to blood or fluids that contain the virus, either through an open cut or during a transfusion.  If a virus enters your body, your body's defense system (immune system) will try to fight the virus. You may be at higher risk for a viral illness if your immune system is weak. What are the signs or symptoms? Symptoms vary depending on the type of virus and the location of the cells that it invades. Common symptoms of the main types of viral illnesses  include: Cold and flu viruses  Fever.  Headache.  Sore throat.  Muscle aches.  Nasal congestion.  Cough. Digestive system (gastrointestinal) viruses  Fever.  Abdominal pain.  Nausea.  Diarrhea. Liver viruses (hepatitis)  Loss of appetite.  Tiredness.  Yellowing of the skin (jaundice). Brain and spinal cord viruses  Fever.  Headache.  Stiff neck.  Nausea and vomiting.  Confusion or sleepiness. Skin viruses  Warts.  Itching.  Rash. Sexually transmitted viruses  Discharge.  Swelling.  Redness.  Rash. How is this treated? Viruses can be difficult to treat because they live within cells. Antibiotic medicines do not treat viruses because these drugs do not get inside cells. Treatment for a viral illness may include:  Resting and drinking plenty of fluids.  Medicines to relieve symptoms.** YOU MAY USE IBUPROFEN OR ALLEVE TO TREAT YOUR BODY ACHES AND LOW GRADE FEVERS.  These can include over-the-counter medicine for pain and fever, medicines for cough or congestion, and medicines to relieve diarrhea.  Antiviral medicines. These drugs are available only for certain types of viruses. They may help reduce flu symptoms if taken early. There are also many antiviral medicines for hepatitis and HIV/AIDS.  Some viral illnesses can be prevented with vaccinations. A common example is the flu shot. Follow these instructions at home: Medicines   Take over-the-counter and prescription medicines only as told by your health care provider.  If you were prescribed an antiviral medicine, take it as told by your health care provider.  Do not stop taking the medicine even if you start to feel better.  Be aware of when antibiotics are needed and when they are not needed. Antibiotics do not treat viruses. If your health care provider thinks that you may have a bacterial infection as well as a viral infection, you may get an antibiotic. ? Do not ask for an antibiotic  prescription if you have been diagnosed with a viral illness. That will not make your illness go away faster. ? Frequently taking antibiotics when they are not needed can lead to antibiotic resistance. When this develops, the medicine no longer works against the bacteria that it normally fights. General instructions  Drink enough fluids to keep your urine clear or pale yellow.  Rest as much as possible.  Return to your normal activities as told by your health care provider. Ask your health care provider what activities are safe for you.  Keep all follow-up visits as told by your health care provider. This is important. How is this prevented? Take these actions to reduce your risk of viral infection:  Eat a healthy diet and get enough rest.  Wash your hands often with soap and water. This is especially important when you are in public places. If soap and water are not available, use hand sanitizer.  Avoid close contact with friends and family who have a viral illness.  If you travel to areas where viral gastrointestinal infection is common, avoid drinking water or eating raw food.  Keep your immunizations up to date. Get a flu shot every year as told by your health care provider.  Do not share toothbrushes, nail clippers, razors, or needles with other people.  Always practice safe sex.  Contact a health care provider if:  You have symptoms of a viral illness that do not go away.  Your symptoms come back after going away.  Your symptoms get worse. Get help right away if:  You have trouble breathing.  You have a severe headache or a stiff neck.  You have severe vomiting or abdominal pain. This information is not intended to replace advice given to you by your health care provider. Make sure you discuss any questions you have with your health care provider. Document Released: 05/07/2015 Document Revised: 06/09/2015 Document Reviewed: 05/07/2015 Elsevier Interactive Patient  Education  [employer].

## 2017-03-09 ENCOUNTER — Other Ambulatory Visit: Payer: Self-pay | Admitting: Family Medicine

## 2017-06-28 ENCOUNTER — Other Ambulatory Visit: Payer: Self-pay | Admitting: Family Medicine

## 2017-07-13 LAB — HM PAP SMEAR

## 2017-07-23 ENCOUNTER — Encounter: Payer: Self-pay | Admitting: Family Medicine

## 2017-07-23 ENCOUNTER — Other Ambulatory Visit: Payer: Self-pay

## 2017-07-23 ENCOUNTER — Ambulatory Visit (INDEPENDENT_AMBULATORY_CARE_PROVIDER_SITE_OTHER): Payer: BLUE CROSS/BLUE SHIELD | Admitting: Family Medicine

## 2017-07-23 VITALS — BP 102/68 | HR 80 | Temp 98.0°F | Resp 16 | Ht 60.0 in | Wt 117.1 lb

## 2017-07-23 DIAGNOSIS — Z0000 Encounter for general adult medical examination without abnormal findings: Secondary | ICD-10-CM | POA: Diagnosis not present

## 2017-07-23 DIAGNOSIS — D518 Other vitamin B12 deficiency anemias: Secondary | ICD-10-CM | POA: Diagnosis not present

## 2017-07-23 DIAGNOSIS — E785 Hyperlipidemia, unspecified: Secondary | ICD-10-CM

## 2017-07-23 LAB — CBC WITH DIFFERENTIAL/PLATELET
BASOS ABS: 0 10*3/uL (ref 0.0–0.1)
Basophils Relative: 0.7 % (ref 0.0–3.0)
EOS ABS: 0.2 10*3/uL (ref 0.0–0.7)
Eosinophils Relative: 3.5 % (ref 0.0–5.0)
HEMATOCRIT: 38.8 % (ref 36.0–46.0)
Hemoglobin: 13 g/dL (ref 12.0–15.0)
LYMPHS ABS: 2.6 10*3/uL (ref 0.7–4.0)
LYMPHS PCT: 40.7 % (ref 12.0–46.0)
MCHC: 33.6 g/dL (ref 30.0–36.0)
MCV: 90.3 fl (ref 78.0–100.0)
MONOS PCT: 6.5 % (ref 3.0–12.0)
Monocytes Absolute: 0.4 10*3/uL (ref 0.1–1.0)
NEUTROS ABS: 3.1 10*3/uL (ref 1.4–7.7)
Neutrophils Relative %: 48.6 % (ref 43.0–77.0)
PLATELETS: 213 10*3/uL (ref 150.0–400.0)
RBC: 4.29 Mil/uL (ref 3.87–5.11)
RDW: 13.6 % (ref 11.5–15.5)
WBC: 6.5 10*3/uL (ref 4.0–10.5)

## 2017-07-23 LAB — HEPATIC FUNCTION PANEL
ALK PHOS: 46 U/L (ref 39–117)
ALT: 9 U/L (ref 0–35)
AST: 13 U/L (ref 0–37)
Albumin: 4.3 g/dL (ref 3.5–5.2)
BILIRUBIN DIRECT: 0.1 mg/dL (ref 0.0–0.3)
BILIRUBIN TOTAL: 0.4 mg/dL (ref 0.2–1.2)
TOTAL PROTEIN: 6.9 g/dL (ref 6.0–8.3)

## 2017-07-23 LAB — BASIC METABOLIC PANEL
BUN: 10 mg/dL (ref 6–23)
CALCIUM: 9.3 mg/dL (ref 8.4–10.5)
CO2: 25 meq/L (ref 19–32)
Chloride: 109 mEq/L (ref 96–112)
Creatinine, Ser: 0.75 mg/dL (ref 0.40–1.20)
GFR: 96.05 mL/min (ref >60.00)
GLUCOSE: 103 mg/dL — AB (ref 70–99)
POTASSIUM: 3.6 meq/L (ref 3.5–5.1)
SODIUM: 142 meq/L (ref 135–145)

## 2017-07-23 LAB — LIPID PANEL
Cholesterol: 203 mg/dL — ABNORMAL HIGH (ref 0–200)
HDL: 61.9 mg/dL (ref >39.00)
LDL Cholesterol: 122 mg/dL — ABNORMAL HIGH (ref 0–99)
NONHDL: 140.72
Total CHOL/HDL Ratio: 3
Triglycerides: 95 mg/dL (ref 0.0–149.0)
VLDL: 19 mg/dL (ref 0.0–40.0)

## 2017-07-23 LAB — VITAMIN B12: Vitamin B-12: 260 pg/mL (ref 211–911)

## 2017-07-23 LAB — TSH: TSH: 1.76 u[IU]/mL (ref 0.35–4.50)

## 2017-07-23 MED ORDER — EPINEPHRINE 0.3 MG/0.3ML IJ SOAJ: 0.3000 mg | Freq: Once | INTRAMUSCULAR | 3 refills | Status: DC | PRN

## 2017-07-23 NOTE — Assessment & Plan Note (Signed)
Check labs and replete prn. 

## 2017-07-23 NOTE — Progress Notes (Signed)
   Subjective:    Patient ID: Lisa Berg, female    DOB: 05/16/1985, 32 y.o.   MRN: [id number]  HPI CPE- UTD on pap (Dr Henderson Cloudomblin)  Pt is down 16 lbs from last visit.   Review of Systems Patient reports no vision/ hearing changes, adenopathy,fever, weight change,  persistant/recurrent hoarseness , swallowing issues, chest pain, palpitations, edema, persistant/recurrent cough, hemoptysis, dyspnea (rest/exertional/paroxysmal nocturnal), gastrointestinal bleeding (melena, rectal bleeding), abdominal pain, significant heartburn, bowel changes, GU symptoms (dysuria, hematuria, incontinence), Gyn symptoms (abnormal  bleeding, pain),  syncope, focal weakness, memory loss, numbness & tingling, skin/hair/nail changes, abnormal bruising or bleeding, anxiety, or depression.     Objective:   Physical Exam General Appearance:    Alert, cooperative, no distress, appears stated age  Head:    Normocephalic, without obvious abnormality, atraumatic  Eyes:    PERRL, conjunctiva/corneas clear, EOM's intact, fundi    benign, both eyes  Ears:    Normal TM's and external ear canals, both ears  Nose:   Nares normal, septum midline, mucosa normal, no drainage    or sinus tenderness  Throat:   Lips, mucosa, and tongue normal; teeth and gums normal  Neck:   Supple, symmetrical, trachea midline, no adenopathy;    Thyroid: no enlargement/tenderness/nodules  Back:     Symmetric, no curvature, ROM normal, no CVA tenderness  Lungs:     Clear to auscultation bilaterally, respirations unlabored  Chest Wall:    No tenderness or deformity   Heart:    Regular rate and rhythm, S1 and S2 normal, no murmur, rub   or gallop  Breast Exam:    Deferred to GYN  Abdomen:     Soft, non-tender, bowel sounds active all four quadrants,    no masses, no organomegaly  Genitalia:    Deferred to GYN  Rectal:    Extremities:   Extremities normal, atraumatic, no cyanosis or edema  Pulses:   2+ and symmetric all extremities  Skin:    Skin color, texture, turgor normal, no rashes or lesions  Lymph nodes:   Cervical, supraclavicular, and axillary nodes normal  Neurologic:   CNII-XII intact, normal strength, sensation and reflexes    throughout          Assessment & Plan:

## 2017-07-23 NOTE — Assessment & Plan Note (Signed)
Pt's PE WNL.  UTD on GYN.  Check labs.  Anticipatory guidance provided.  

## 2017-07-23 NOTE — Patient Instructions (Signed)
Follow up in 1 year or as needed We'll notify you of your lab results and make any changes if needed Make sure you are eating regularly Call with any questions or concerns Have a great summer!!! 

## 2017-07-24 ENCOUNTER — Encounter: Payer: Self-pay | Admitting: General Practice

## 2017-07-27 ENCOUNTER — Telehealth: Payer: Self-pay | Admitting: Family Medicine

## 2017-07-27 NOTE — Telephone Encounter (Signed)
I have not received a PA from the pharmacy, however, I pushed this through in covermymeds. Waiting on insurance for approval.

## 2017-07-27 NOTE — Telephone Encounter (Signed)
Copied from CRM [id number]. Topic: Quick Communication - See Telephone Encounter >> Jul 27, 2017  1:53 PM Arlyss Gandyichardson, Laelle Bridgett N, NT wrote: CRM for notification. See Telephone encounter for: 07/27/17. Pts husband calling and states that the EPINEPHrine 0.3 mg/0.3 mL IJ SOAJ injection requires PA. Please advise.

## 2017-07-27 NOTE — Telephone Encounter (Signed)
Pt husband made aware that this PA was initiated.

## 2017-08-01 ENCOUNTER — Other Ambulatory Visit: Payer: Self-pay | Admitting: General Practice

## 2017-08-01 NOTE — Telephone Encounter (Signed)
PA was originally denied. Once denial letter was received it advised pt needed generic epi-pen (mylan product). Spoke with pharmacy and they advised pen ws approved however the Co-Pay was going to be $273. Pt will have to talk to insurance company or print co-pay card form from online. Pharmacy was calling to inform pt.

## 2017-09-27 ENCOUNTER — Other Ambulatory Visit: Payer: Self-pay | Admitting: Family Medicine

## 2017-10-26 ENCOUNTER — Other Ambulatory Visit: Payer: Self-pay | Admitting: Family Medicine

## 2018-02-18 ENCOUNTER — Other Ambulatory Visit: Payer: Self-pay | Admitting: Family Medicine

## 2018-04-19 ENCOUNTER — Other Ambulatory Visit: Payer: Self-pay | Admitting: Family Medicine

## 2018-05-21 ENCOUNTER — Other Ambulatory Visit: Payer: Self-pay | Admitting: Family Medicine

## 2018-06-24 ENCOUNTER — Other Ambulatory Visit: Payer: Self-pay | Admitting: Family Medicine

## 2018-07-25 ENCOUNTER — Encounter: Payer: BLUE CROSS/BLUE SHIELD | Admitting: Family Medicine

## 2018-08-05 ENCOUNTER — Other Ambulatory Visit: Payer: Self-pay

## 2018-08-05 ENCOUNTER — Encounter: Payer: BLUE CROSS/BLUE SHIELD | Admitting: Family Medicine

## 2018-10-28 ENCOUNTER — Other Ambulatory Visit: Payer: Self-pay | Admitting: Family Medicine

## 2019-02-24 ENCOUNTER — Other Ambulatory Visit: Payer: Self-pay | Admitting: Family Medicine

## 2019-06-14 ENCOUNTER — Other Ambulatory Visit: Payer: Self-pay | Admitting: Family Medicine

## 2019-06-18 ENCOUNTER — Other Ambulatory Visit: Payer: Self-pay | Admitting: Family Medicine

## 2019-06-20 ENCOUNTER — Encounter: Payer: Self-pay | Admitting: Family Medicine

## 2019-06-20 ENCOUNTER — Other Ambulatory Visit: Payer: Self-pay

## 2019-06-20 ENCOUNTER — Telehealth (INDEPENDENT_AMBULATORY_CARE_PROVIDER_SITE_OTHER): Payer: BLUE CROSS/BLUE SHIELD | Admitting: Family Medicine

## 2019-06-20 VITALS — Ht 60.0 in

## 2019-06-20 DIAGNOSIS — G43009 Migraine without aura, not intractable, without status migrainosus: Secondary | ICD-10-CM

## 2019-06-20 DIAGNOSIS — F419 Anxiety disorder, unspecified: Secondary | ICD-10-CM

## 2019-06-20 MED ORDER — BUSPIRONE HCL 30 MG PO TABS: ORAL_TABLET | ORAL | 3 refills | Status: DC

## 2019-06-20 MED ORDER — TOPIRAMATE 200 MG PO TABS: 200.0000 mg | ORAL_TABLET | Freq: Every day | ORAL | 3 refills | Status: DC

## 2019-06-20 NOTE — Progress Notes (Signed)
I have discussed the procedure for the virtual visit with the patient who has given consent to proceed with assessment and treatment.   Pt cannot obtain vitals.   Geannie Risen, CMA

## 2019-06-20 NOTE — Progress Notes (Signed)
   Virtual Visit via Video   I connected with patient on 06/20/19 at  2:30 PM EDT by a video enabled telemedicine application and verified that I am speaking with the correct person using two identifiers.  Location patient: Home Location provider: [facility], Office Persons participating in the virtual visit: Patient, Provider, CMA (Jess B)  I discussed the limitations of evaluation and management by telemedicine and the availability of in person appointments. The patient expressed understanding and agreed to proceed.  Subjective:   HPI:   Migraine- pt feels the Topamax helps to keep frequency down and that if she wasn't on medication things would be worse.  Pt has low level HAs 'every day' but HAs worsen w/ weather changes.  Pt does not take Triptans for rescue medication due to hx of rebound HAs that were more severe than initial migraine.  Pt feels that things are currently 'pretty good'.  Anxiety- pt feels Buspar has been helpful.  On 30mg  BID.  Pt feels that things are less overwhelming, easier to handle.  Pt is pleased w/ current treatment and mood.  ROS:   See pertinent positives and negatives per HPI.  Patient Active Problem List   Diagnosis Date Noted  . Physical exam 02/19/2015  . Generalized anxiety disorder 09/22/2014  . Myalgia and myositis 11/21/2013  . Internal hemorrhoid 04/14/2013  . 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 OTHER VITAMIN B12 DEFICIENCY ANEMIA 04/16/2009  . CONTACT DERMATITIS&OTHER ECZEMA DUE UNSPEC CAUSE 04/16/2009  . NUMBNESS 02/19/2009  . Migraine 04/03/2008    Social History   Tobacco Use  . Smoking status: Never Smoker  . Smokeless tobacco: Never Used  Substance Use Topics  . Alcohol use: No    Current Outpatient Medications:  .  busPIRone (BUSPAR) 30 MG tablet, TAKE 1 TABLET BY MOUTH TWICE DAILY, Disp: 60 tablet, Rfl: 3 .  Docusate Calcium (STOOL SOFTENER PO), Take by mouth at bedtime as needed., Disp: , Rfl:  .  fexofenadine  (ALLEGRA) 30 MG tablet, Take 30 mg by mouth at bedtime as needed., Disp: , Rfl:  .  Prenatal Vit-Fe Fumarate-FA (PRENATAL MULTIVITAMIN) TABS tablet, Take 1 tablet by mouth at bedtime. , Disp: , Rfl:  .  topiramate (TOPAMAX) 200 MG tablet, TAKE 1 TABLET BY MOUTH ONCE DAILY, Disp: 30 tablet, Rfl: 3 .  EPINEPHrine 0.3 mg/0.3 mL IJ SOAJ injection, Inject 0.3 mLs (0.3 mg total) into the muscle once as needed (Use as directed for allergy reaction.). (Patient not taking: Reported on 06/20/2019), Disp: 1 Device, Rfl: 3  Allergies  Allergen Reactions  . Clindamycin/Lincomycin Hives  . Bee Venom Swelling  . Latex Rash  . Orange Concentrate [Flavoring Agent] Rash  . Penicillins Rash    Objective:   Ht 5' (1.524 m)   BMI 22.87 kg/m   AAOx3, NAD NCAT, EOMI No obvious CN deficits Coloring WNL Pt is able to speak clearly, coherently without shortness of breath or increased work of breathing.  Thought process is linear.  Mood is appropriate.   Assessment and Plan:   Migraine- pt takes Topamax once daily as a preventative medication.  She does not use Triptans for rescue as they cause worse rebound headaches.  She feels that things are adequately managed at this time and is not interested in making changes.  Refill provided.  Anxiety- doing well on Buspar 30mg  BID.  Not interested in changing medication at this time.  Refill provided.   08/20/2019, MD 06/20/2019

## 2019-09-10 LAB — HM PAP SMEAR

## 2019-10-25 ENCOUNTER — Other Ambulatory Visit: Payer: Self-pay | Admitting: Family Medicine

## 2020-02-12 ENCOUNTER — Other Ambulatory Visit: Payer: Self-pay | Admitting: Family Medicine

## 2020-03-11 ENCOUNTER — Telehealth: Payer: Self-pay | Admitting: Family Medicine

## 2020-03-11 NOTE — Telephone Encounter (Signed)
She is currently on max dose of that medication.  Since she is feeling overwhelmed, I would like her to schedule an visit so we can discuss next steps or other options

## 2020-03-11 NOTE — Telephone Encounter (Signed)
Patient returned call and stated that she is very overwhelmed and very stressed and wanted to know if you can up her dosage of her Buspar. She is on 60mg  30 in the am and 30 in the pm. Please advise

## 2020-03-11 NOTE — Telephone Encounter (Signed)
Patient has a question about Busperol - she wants to know if she should change her dosage because she feels like it doesn't work as well now.

## 2020-03-11 NOTE — Telephone Encounter (Signed)
Returned patients call on the number given. Husband answered the phone and stated that he had called for her from his phone and gave me her number. I called and she did not answer nor could I leave a vm. Patient will probably call back in regards to concerns of medication.

## 2020-03-11 NOTE — Telephone Encounter (Signed)
Patient is scheduled for 03/26/20 at 2:30 for a video visit to discuss concerns of stress.

## 2020-03-26 ENCOUNTER — Encounter: Payer: Self-pay | Admitting: Family Medicine

## 2020-03-26 ENCOUNTER — Telehealth (INDEPENDENT_AMBULATORY_CARE_PROVIDER_SITE_OTHER): Payer: BLUE CROSS/BLUE SHIELD | Admitting: Family Medicine

## 2020-03-26 DIAGNOSIS — F419 Anxiety disorder, unspecified: Secondary | ICD-10-CM

## 2020-03-26 MED ORDER — SERTRALINE HCL 25 MG PO TABS: 25.0000 mg | ORAL_TABLET | Freq: Every day | ORAL | 3 refills | Status: DC

## 2020-03-26 NOTE — Progress Notes (Signed)
Virtual Visit via Video   I connected with patient on 03/26/20 at  2:30 PM EDT by a video enabled telemedicine application and verified that I am speaking with the correct person using two identifiers.  Location patient: Home Location provider: [place] [facility], Office Persons participating in the virtual visit: Patient, Provider, CMA (Sabrina M)  I discussed the limitations of evaluation and management by telemedicine and the availability of in person appointments. The patient expressed understanding and agreed to proceed.  Subjective:   HPI:   Anxiety- 'I feel like i'm in a rut and I don't know why'.  Son is having nightmares and she doesn't know how to deal with that.  She feels she is getting that under control but son is not sleeping in his room, he is sleeping w/ her.  Son is not doing well in public school- was in private school last year.  Is on highest dose of Buspar.  Reports marriage is stressful- 'but we're working on it'.  Is safe in relationship.  Pt reports work is good.  Pt feels sxs are more anxiety related than depression.  Has been on Celexa in the past but doesn't recall any other medications.  Is not trying to get pregnant but not preventing it.  Pt has previously had a bad experience w/ counseling  ROS:   See pertinent positives and negatives per HPI.  Patient Active Problem List   Diagnosis Date Noted  . Physical exam 02/19/2015  . Generalized anxiety disorder 09/22/2014  . Myalgia and myositis 11/21/2013  . Internal hemorrhoid 04/14/2013  . 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 OTHER VITAMIN B12 DEFICIENCY ANEMIA 04/16/2009  . CONTACT DERMATITIS&OTHER ECZEMA DUE UNSPEC CAUSE 04/16/2009  . NUMBNESS 02/19/2009  . Migraine 04/03/2008    Social History   Tobacco Use  . Smoking status: Never Smoker  . Smokeless tobacco: Never Used  Substance Use Topics  . Alcohol use: No    Current Outpatient Medications:  .  busPIRone (BUSPAR) 30 MG tablet, TAKE 1  TABLET BY MOUTH TWICE DAILY, Disp: 60 tablet, Rfl: 3 .  Docusate Calcium (STOOL SOFTENER PO), Take by mouth at bedtime as needed., Disp: , Rfl:  .  fexofenadine (ALLEGRA) 30 MG tablet, Take 30 mg by mouth at bedtime as needed., Disp: , Rfl:  .  Prenatal Vit-Fe Fumarate-FA (PRENATAL MULTIVITAMIN) TABS tablet, Take 1 tablet by mouth at bedtime. , Disp: , Rfl:  .  topiramate (TOPAMAX) 200 MG tablet, TAKE 1 TABLET BY MOUTH DAILY, Disp: 30 tablet, Rfl: 3 .  EPINEPHrine 0.3 mg/0.3 mL IJ SOAJ injection, Inject 0.3 mLs (0.3 mg total) into the muscle once as needed (Use as directed for allergy reaction.). (Patient not taking: No sig reported), Disp: 1 Device, Rfl: 3  Allergies  Allergen Reactions  . Clindamycin/Lincomycin Hives  . Bee Venom Swelling  . Latex Rash  . Orange Concentrate [Flavoring Agent] Rash  . Penicillins Rash    Objective:   There were no vitals taken for this visit. AAOx3, NAD NCAT, EOMI No obvious CN deficits Coloring WNL Pt is able to speak clearly, coherently without shortness of breath or increased work of breathing.  Thought process is linear.  Mood is appropriate.   Assessment and Plan:   Anxiety- deteriorated.  Pt is having a hard time at home and is feeling overwhelmed.  Her inability to prevent her son's nightmares has triggered her anxiety.  She is already on max dose of Buspar so at this time will  add SSRI.  Will start w/ Sertraline 25mg  daily and titrate as needed.  Pt expressed understanding and is in agreement w/ plan.   , MD 03/26/2020

## 2020-03-26 NOTE — Progress Notes (Signed)
I connected with  Lisa Berg on 03/26/20 by a video enabled telemedicine application and verified that I am speaking with the correct person using two identifiers.   I discussed the limitations of evaluation and management by telemedicine. The patient expressed understanding and agreed to proceed.

## 2020-03-30 ENCOUNTER — Telehealth: Payer: Self-pay | Admitting: Family Medicine

## 2020-03-30 NOTE — Telephone Encounter (Signed)
LM asking pt to call back to schedule an 3-4 wk reck mood with Tabori from last appt which was 03/26/20

## 2020-03-31 ENCOUNTER — Telehealth: Payer: Self-pay | Admitting: Family Medicine

## 2020-03-31 NOTE — Telephone Encounter (Signed)
Patients husband said that the generic Sertaline is causing her to be nauseous and dizzy with headaches.  Please advise.

## 2020-03-31 NOTE — Telephone Encounter (Signed)
Typically the nausea and dizziness that can occur when starting these medications go away in 5-7 days.  If it is lasting longer than that, we need to change medications.  Just let me know

## 2020-03-31 NOTE — Telephone Encounter (Signed)
LMOVM to return call.

## 2020-05-07 ENCOUNTER — Telehealth: Payer: BLUE CROSS/BLUE SHIELD | Admitting: Family Medicine

## 2020-05-07 ENCOUNTER — Encounter: Payer: Self-pay | Admitting: Family Medicine

## 2020-05-07 ENCOUNTER — Telehealth (INDEPENDENT_AMBULATORY_CARE_PROVIDER_SITE_OTHER): Payer: BLUE CROSS/BLUE SHIELD | Admitting: Family Medicine

## 2020-05-07 DIAGNOSIS — F419 Anxiety disorder, unspecified: Secondary | ICD-10-CM | POA: Diagnosis not present

## 2020-05-07 NOTE — Progress Notes (Signed)
   Virtual Visit via Video   I connected with patient on 05/07/20 at 11:30 AM EDT by a video enabled telemedicine application and verified that I am speaking with the correct person using two identifiers.  Location patient: Home Location provider: [facility], Office Persons participating in the virtual visit: Patient, Provider, CMA Martie Lee)  I discussed the limitations of evaluation and management by telemedicine and the availability of in person appointments. The patient expressed understanding and agreed to proceed.  Subjective:   HPI:   Anxiety- at last visit we added low dose Sertraline in addition to her max dose of Buspar.  Pt reports doing better.  Pt reports she still has good days and bad days but 'i'm trying to work thru it'.  Pt feels that there are more good days than bad.  Son is still sleeping with them which is helping his nightmares.  Feels marriage is headed in the right direction.    ROS:   See pertinent positives and negatives per HPI.  Patient Active Problem List   Diagnosis Date Noted  . Physical exam 02/19/2015  . Generalized anxiety disorder 09/22/2014  . Myalgia and myositis 11/21/2013  . Internal hemorrhoid 04/14/2013  . 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 OTHER VITAMIN B12 DEFICIENCY ANEMIA 04/16/2009  . CONTACT DERMATITIS&OTHER ECZEMA DUE UNSPEC CAUSE 04/16/2009  . NUMBNESS 02/19/2009  . Migraine 04/03/2008    Social History   Tobacco Use  . Smoking status: Never Smoker  . Smokeless tobacco: Never Used  Substance Use Topics  . Alcohol use: No    Current Outpatient Medications:  .  busPIRone (BUSPAR) 30 MG tablet, TAKE 1 TABLET BY MOUTH TWICE DAILY, Disp: 60 tablet, Rfl: 3 .  Docusate Calcium (STOOL SOFTENER PO), Take by mouth at bedtime as needed., Disp: , Rfl:  .  fexofenadine (ALLEGRA) 30 MG tablet, Take 30 mg by mouth at bedtime as needed., Disp: , Rfl:  .  Prenatal Vit-Fe Fumarate-FA (PRENATAL MULTIVITAMIN) TABS tablet, Take 1  tablet by mouth at bedtime. , Disp: , Rfl:  .  sertraline (ZOLOFT) 25 MG tablet, Take 1 tablet (25 mg total) by mouth daily., Disp: 30 tablet, Rfl: 3 .  topiramate (TOPAMAX) 200 MG tablet, TAKE 1 TABLET BY MOUTH DAILY, Disp: 30 tablet, Rfl: 3 .  EPINEPHrine 0.3 mg/0.3 mL IJ SOAJ injection, Inject 0.3 mLs (0.3 mg total) into the muscle once as needed (Use as directed for allergy reaction.). (Patient not taking: No sig reported), Disp: 1 Device, Rfl: 3  Allergies  Allergen Reactions  . Clindamycin/Lincomycin Hives  . Bee Venom Swelling  . Latex Rash  . Orange Concentrate [Flavoring Agent] Rash  . Penicillins Rash    Objective:   There were no vitals taken for this visit. AAOx3, NAD NCAT, EOMI No obvious CN deficits Coloring WNL Pt is able to speak clearly, coherently without shortness of breath or increased work of breathing.  Thought process is linear.  Mood is appropriate.   Assessment and Plan:   Anxiety- improved w/ addition of Sertraline.  Pt feels that meds are appropriate at this time and is not interested in making any changes.  Will follow.   Neena Rhymes, MD 05/07/2020

## 2020-05-07 NOTE — Progress Notes (Signed)
I connected with  Lisa Berg on 05/07/20 by a video enabled telemedicine application and verified that I am speaking with the correct person using two identifiers.   I discussed the limitations of evaluation and management by telemedicine. The patient expressed understanding and agreed to proceed.

## 2020-06-04 ENCOUNTER — Telehealth: Payer: Self-pay

## 2020-06-04 NOTE — Telephone Encounter (Signed)
Patient and husband are wanting to try for another child and has concerns over continuing topamax, zoloft, and buspar. Wanting to know if there is other medications she can start or to completely stop all medications.

## 2020-06-04 NOTE — Telephone Encounter (Signed)
Please have her schedule an OV as this is a more involved discussion

## 2020-06-08 NOTE — Telephone Encounter (Signed)
Pt is scheduled for an appt [facility]  

## 2020-06-09 ENCOUNTER — Telehealth (INDEPENDENT_AMBULATORY_CARE_PROVIDER_SITE_OTHER): Payer: BLUE CROSS/BLUE SHIELD | Admitting: Family Medicine

## 2020-06-09 ENCOUNTER — Encounter: Payer: Self-pay | Admitting: Family Medicine

## 2020-06-09 DIAGNOSIS — G43009 Migraine without aura, not intractable, without status migrainosus: Secondary | ICD-10-CM | POA: Diagnosis not present

## 2020-06-09 DIAGNOSIS — Z7189 Other specified counseling: Secondary | ICD-10-CM

## 2020-06-09 DIAGNOSIS — F411 Generalized anxiety disorder: Secondary | ICD-10-CM | POA: Diagnosis not present

## 2020-06-09 MED ORDER — TOPIRAMATE 50 MG PO TABS: 50.0000 mg | ORAL_TABLET | Freq: Every day | ORAL | 0 refills | Status: DC

## 2020-06-09 MED ORDER — TOPIRAMATE 100 MG PO TABS: 100.0000 mg | ORAL_TABLET | Freq: Every day | ORAL | 0 refills | Status: DC

## 2020-06-09 MED ORDER — TOPIRAMATE 25 MG PO TABS: 25.0000 mg | ORAL_TABLET | Freq: Every day | ORAL | 0 refills | Status: DC

## 2020-06-09 NOTE — Progress Notes (Signed)
Virtual Visit via Video   I connected with patient on 06/09/20 at  7:30 AM EDT by a video enabled telemedicine application and verified that I am speaking with the correct person using two identifiers.  Location patient: Home Location provider: [facility], Office Persons participating in the virtual visit: Patient, Provider, CMA (Sabrina M)  I discussed the limitations of evaluation and management by telemedicine and the availability of in person appointments. The patient expressed understanding and agreed to proceed.  Subjective:   HPI:   Medication adjustments- pt is thinking about having another baby and wants to make sure her medications are safe.  Pt feels anxiety is at a good place but wants to know if medication adjustments are needed.  She also is aware that she needs to come off Topamax but doesn't know how to do so.  ROS:   See pertinent positives and negatives per HPI.  Patient Active Problem List   Diagnosis Date Noted  . Physical exam 02/19/2015  . Generalized anxiety disorder 09/22/2014  . Myalgia and myositis 11/21/2013  . Internal hemorrhoid 04/14/2013  . 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 OTHER VITAMIN B12 DEFICIENCY ANEMIA 04/16/2009  . CONTACT DERMATITIS&OTHER ECZEMA DUE UNSPEC CAUSE 04/16/2009  . NUMBNESS 02/19/2009  . Migraine 04/03/2008    Social History   Tobacco Use  . Smoking status: Never Smoker  . Smokeless tobacco: Never Used  Substance Use Topics  . Alcohol use: No    Current Outpatient Medications:  .  busPIRone (BUSPAR) 30 MG tablet, TAKE 1 TABLET BY MOUTH TWICE DAILY, Disp: 60 tablet, Rfl: 3 .  Docusate Calcium (STOOL SOFTENER PO), Take by mouth at bedtime as needed., Disp: , Rfl:  .  fexofenadine (ALLEGRA) 30 MG tablet, Take 30 mg by mouth at bedtime as needed., Disp: , Rfl:  .  Prenatal Vit-Fe Fumarate-FA (PRENATAL MULTIVITAMIN) TABS tablet, Take 1 tablet by mouth at bedtime. , Disp: , Rfl:  .  sertraline (ZOLOFT) 25 MG  tablet, Take 1 tablet (25 mg total) by mouth daily., Disp: 30 tablet, Rfl: 3 .  topiramate (TOPAMAX) 200 MG tablet, TAKE 1 TABLET BY MOUTH DAILY, Disp: 30 tablet, Rfl: 3 .  EPINEPHrine 0.3 mg/0.3 mL IJ SOAJ injection, Inject 0.3 mLs (0.3 mg total) into the muscle once as needed (Use as directed for allergy reaction.). (Patient not taking: No sig reported), Disp: 1 Device, Rfl: 3  Allergies  Allergen Reactions  . Clindamycin/Lincomycin Hives  . Bee Venom Swelling  . Latex Rash  . Orange Concentrate [Flavoring Agent] Rash  . Penicillins Rash    Objective:   There were no vitals taken for this visit. AAOx3, NAD NCAT, EOMI No obvious CN deficits Coloring WNL Pt is able to speak clearly, coherently without shortness of breath or increased work of breathing.  Thought process is linear.  Mood is appropriate.   Assessment and Plan:   Anxiety- currently stable, but pt is interested in weaning off Sertraline for a possible upcoming pregnancy.  Since she is on a very low dose, will decrease the Sertraline to 25mg  every other day x2 weeks and then stop.  Since Buspar is Category B in pregnancy, she can continue at this time.  Pt expressed understanding and is in agreement w/ plan.   Migraines- pt has been on high dose Topamax for awhile.  I discussed that we need to wean fairly slowly to avoid triggering side effects or migraines.  We agreed to decrease from 200 --> 150mg  daily  x2 weeks, then 100mg  daily x2 weeks, and then 50mg  daily x2 weeks, and then 25mg  daily x2 weeks and then 25mg  every other day x2 weeks.  Pt expressed understanding and is in agreement w/ plan.   Medication counseling- see above.  Pt also on PNV at this time.  , MD 06/09/2020

## 2020-06-09 NOTE — Progress Notes (Signed)
I connected with  Lisa Berg on 06/09/20 by a video enabled telemedicine application and verified that I am speaking with the correct person using two identifiers.   I discussed the limitations of evaluation and management by telemedicine. The patient expressed understanding and agreed to proceed.

## 2020-06-24 ENCOUNTER — Other Ambulatory Visit: Payer: Self-pay | Admitting: Family Medicine

## 2020-06-24 NOTE — Telephone Encounter (Signed)
Patient was weaning off. Please advise if this still needs to be filled

## 2020-07-05 ENCOUNTER — Other Ambulatory Visit: Payer: Self-pay | Admitting: Family Medicine

## 2020-07-07 ENCOUNTER — Encounter: Payer: Self-pay | Admitting: *Deleted

## 2020-07-27 ENCOUNTER — Other Ambulatory Visit: Payer: Self-pay | Admitting: Family Medicine

## 2020-11-03 ENCOUNTER — Other Ambulatory Visit: Payer: Self-pay | Admitting: Family Medicine

## 2020-12-15 ENCOUNTER — Telehealth (INDEPENDENT_AMBULATORY_CARE_PROVIDER_SITE_OTHER): Payer: BLUE CROSS/BLUE SHIELD | Admitting: Family Medicine

## 2020-12-15 ENCOUNTER — Encounter: Payer: Self-pay | Admitting: Family Medicine

## 2020-12-15 ENCOUNTER — Telehealth: Payer: Self-pay

## 2020-12-15 DIAGNOSIS — G43009 Migraine without aura, not intractable, without status migrainosus: Secondary | ICD-10-CM | POA: Diagnosis not present

## 2020-12-15 DIAGNOSIS — R635 Abnormal weight gain: Secondary | ICD-10-CM

## 2020-12-15 MED ORDER — PROPRANOLOL HCL ER 60 MG PO CP24: 60.0000 mg | ORAL_CAPSULE | Freq: Every day | ORAL | 3 refills | Status: DC

## 2020-12-15 MED ORDER — TOPIRAMATE 25 MG PO TABS: ORAL_TABLET | ORAL | 0 refills | Status: DC

## 2020-12-15 NOTE — Telephone Encounter (Signed)
Caller name: Ludie Hudon   On DPR? :NO  Call back number:[phone]  Provider they see: Beverely Low   Reason for call:After talking with her father she does not feel comfortable with the medication that Dr Beverely Low discussed putting her on today beta blocker Please advise what she needs to do.

## 2020-12-15 NOTE — Progress Notes (Signed)
Virtual Visit via Video   I connected with patient on 12/15/20 at  2:00 PM EST by a video enabled telemedicine application and verified that I am speaking with the correct person using two identifiers.  Location patient: Home Location provider: [facility], Office Persons participating in the virtual visit: Patient, Provider, [place] Billings C)  I discussed the limitations of evaluation and management by telemedicine and the availability of in person appointments. The patient expressed understanding and agreed to proceed.  Subjective:   HPI:   HAs- pt reports sxs 'are worsening each day'.  Sometimes she will wake up w/ the HA and then it resolves, or it lasts for multiple days, or she doesn't have one at all.  HAs are typically frontal or on either side.  No N/V.  + photo and phonophobia.  Denies aura.  Has successfully weaned off Topamax.  HAs have just started to worsen.  Is trying to maintain good water intake.  Is still considering pregnancy.  Has previously used rescue migraine medication.  Weight gain- pt is upset about recent weight gain since stopping Sertraline.  Is eating 3 meals/day.  Attempting to exercise.  Not following any particular diet.  ROS:   See pertinent positives and negatives per HPI.  Patient Active Problem List   Diagnosis Date Noted   Physical exam 02/19/2015   Generalized anxiety disorder 09/22/2014   Myalgia and myositis 11/21/2013   Internal hemorrhoid 04/14/2013   Allergy to bee sting 05/16/2012   OTHER VITAMIN B12 DEFICIENCY ANEMIA 04/16/2009   CONTACT DERMATITIS&OTHER ECZEMA DUE UNSPEC CAUSE 04/16/2009   NUMBNESS 02/19/2009   Migraine 04/03/2008    Social History   Tobacco Use   Smoking status: Never   Smokeless tobacco: Never  Substance Use Topics   Alcohol use: No    Current Outpatient Medications:    busPIRone (BUSPAR) 30 MG tablet, TAKE 1 TABLET BY MOUTH TWICE DAILY, Disp: 60 tablet, Rfl: 3   fexofenadine (ALLEGRA) 30 MG  tablet, Take 30 mg by mouth at bedtime as needed., Disp: , Rfl:    Prenatal Vit-Fe Fumarate-FA (PRENATAL MULTIVITAMIN) TABS tablet, Take 1 tablet by mouth at bedtime. , Disp: , Rfl:    Docusate Calcium (STOOL SOFTENER PO), Take by mouth at bedtime as needed. (Patient not taking: Reported on 12/15/2020), Disp: , Rfl:    EPINEPHrine 0.3 mg/0.3 mL IJ SOAJ injection, Inject 0.3 mLs (0.3 mg total) into the muscle once as needed (Use as directed for allergy reaction.). (Patient not taking: Reported on 06/20/2019), Disp: 1 Device, Rfl: 3   sertraline (ZOLOFT) 25 MG tablet, TAKE 1 TABLET BY MOUTH DAILY (Patient not taking: Reported on 12/15/2020), Disp: 30 tablet, Rfl: 3   topiramate (TOPAMAX) 100 MG tablet, Take 1 tablet (100 mg total) by mouth daily. In combination with 50mg  tabs x2 weeks for a total of 150mg  and then 1 tab po daily x2 weeks (100mg  daily) (Patient not taking: Reported on 12/15/2020), Disp: 30 tablet, Rfl: 0   topiramate (TOPAMAX) 25 MG tablet, Take 1 tablet (25 mg total) by mouth daily. x2 weeks and then decrease to every other day x2 weeks (Patient not taking: Reported on 12/15/2020), Disp: 30 tablet, Rfl: 0   topiramate (TOPAMAX) 50 MG tablet, Take 1 tablet (50 mg total) by mouth daily. In combination with the 100mg  daily for a total of 150mg  daily x2 weeks.  Then after the 100mg  daily x2 weeks, take the 50mg  daily x2 weeks (Patient not taking: Reported on 12/15/2020), Disp:  30 tablet, Rfl: 0  Allergies  Allergen Reactions   Clindamycin/Lincomycin Hives   Bee Venom Swelling   Latex Rash   Orange Concentrate [Flavoring Agent] Rash   Penicillins Rash    Objective:   There were no vitals taken for this visit. AAOx3, NAD NCAT, EOMI No obvious CN deficits Coloring WNL Pt is able to speak clearly, coherently without shortness of breath or increased work of breathing.  Thought process is linear.  Mood is appropriate.   Assessment and Plan:   Migraines- deteriorated.  Pt states that  migraines have again worsened since weaning off Topamax.  She weaned off medication due to possibly desiring pregnancy.  She reports she had abortive txs in the past but they were not effective.  She is not currently taking anything for her headaches.  Encouraged her to take Excedrin Migraine at first sign of headache.  Discussed starting Propranolol as a daily prophylaxis.  She was in agreement w/ plan but then called back almost immediately to say she changed her mind and would prefer to resume low dose Topamax.  She is aware of the pregnancy risk and states she will stop medication if she becomes pregnant.  If no improvement will need referral to neuro.  Pt expressed understanding and is in agreement w/ plan.   Weight gain- new.  Pt admits to not exercising regularly or following a particular diet.  Encouraged her to continue to eat regularly, increase her water intake, and follow a low carb diet w/ unlimited fruits and veggies.  Stressed the importance of adding 4-5 days of physical activity weekly to increase her caloric burn and improve metabolism.  Will follow.  Neena Rhymes, MD 12/15/2020

## 2020-12-15 NOTE — Telephone Encounter (Signed)
LVM for patient to return my call 

## 2020-12-15 NOTE — Telephone Encounter (Signed)
Prescription sent for 1 month of Topamax.  If she becomes pregnant she will need to stop.  Propranolol is the safe option but this was sent at her request

## 2020-12-15 NOTE — Telephone Encounter (Signed)
Patient aware.

## 2020-12-15 NOTE — Telephone Encounter (Signed)
Patient states that her hesitation with the medication is what she saw her father go through while taking that medication. Also stated that she felt a low dose of topamax would be better for her. I let her know I would express this to Dr Beverely Low and see what she advised

## 2021-01-15 ENCOUNTER — Other Ambulatory Visit: Payer: Self-pay | Admitting: Family Medicine

## 2021-01-19 ENCOUNTER — Other Ambulatory Visit: Payer: Self-pay | Admitting: Family Medicine

## 2021-01-21 NOTE — Telephone Encounter (Signed)
Is she asking to continue the Topamax?  If so, I need to send in a new prescription and not a starter pack

## 2021-02-03 ENCOUNTER — Other Ambulatory Visit: Payer: Self-pay | Admitting: Obstetrics and Gynecology

## 2021-02-03 DIAGNOSIS — N979 Female infertility, unspecified: Secondary | ICD-10-CM

## 2021-02-21 ENCOUNTER — Other Ambulatory Visit: Payer: Self-pay | Admitting: Family Medicine

## 2021-02-21 NOTE — Telephone Encounter (Signed)
Patient is requesting a refill of the following medications: Requested Prescriptions   Pending Prescriptions Disp Refills   busPIRone (BUSPAR) 30 MG tablet [Pharmacy Med Name: BUSPIRONE HCL 30 MG TAB] 60 tablet 3    Sig: TAKE 1 TABLET BY MOUTH TWICE DAILY    Date of patient request: 02/20/2021 Last office visit: 12/15/2020 video visit Date of last refill: 11/03/2020 Last refill amount: 60 tablets 3 refills  Follow up time period per chart: none

## 2021-03-07 ENCOUNTER — Ambulatory Visit
Admission: RE | Admit: 2021-03-07 | Discharge: 2021-03-07 | Disposition: A | Discharge status: Home / Self Care | Payer: 59 | Source: Ambulatory Visit | Attending: Obstetrics and Gynecology | Admitting: Obstetrics and Gynecology

## 2021-03-07 DIAGNOSIS — N979 Female infertility, unspecified: Secondary | ICD-10-CM | POA: Diagnosis not present

## 2021-03-17 ENCOUNTER — Other Ambulatory Visit: Payer: Self-pay | Admitting: Family Medicine

## 2021-05-21 ENCOUNTER — Other Ambulatory Visit: Payer: Self-pay | Admitting: Family Medicine

## 2021-06-10 ENCOUNTER — Telehealth: Payer: Self-pay | Admitting: Family Medicine

## 2021-06-10 NOTE — Telephone Encounter (Signed)
Pt is schedule for next week with Neva Seat

## 2021-06-10 NOTE — Telephone Encounter (Signed)
Will need an appointment to discuss 

## 2021-06-10 NOTE — Telephone Encounter (Signed)
PT husband stating that wife want to try some weight loss medication. Pt wants to know will it cause issues with other medications (topiramate 25 mg and buspirone 30 mg.    PT stating that she been having more headache since her topiramate dosage decreased. PT wants to know can she get  a increase her dosage

## 2021-06-13 ENCOUNTER — Ambulatory Visit (INDEPENDENT_AMBULATORY_CARE_PROVIDER_SITE_OTHER): Payer: 59 | Admitting: Family Medicine

## 2021-06-13 ENCOUNTER — Encounter: Payer: Self-pay | Admitting: Family Medicine

## 2021-06-13 VITALS — BP 118/70 | HR 112 | Temp 98.0°F | Resp 16 | Ht 61.0 in | Wt 149.1 lb

## 2021-06-13 DIAGNOSIS — G43009 Migraine without aura, not intractable, without status migrainosus: Secondary | ICD-10-CM | POA: Diagnosis not present

## 2021-06-13 MED ORDER — TOPIRAMATE 50 MG PO TABS: 50.0000 mg | ORAL_TABLET | Freq: Two times a day (BID) | ORAL | 3 refills | Status: DC

## 2021-06-13 MED ORDER — UBRELVY 50 MG PO TABS: 1.0000 | ORAL_TABLET | Freq: Every day | ORAL | 3 refills | Status: DC | PRN

## 2021-06-13 NOTE — Progress Notes (Signed)
   Subjective:    Patient ID: Lisa Berg, female    DOB: 1985-09-28, 36 y.o.   MRN: [id number]  HPI Migraine- pt is tolerating Topamax 25mg  BID.  Pt woke this morning w/ L sided migraine.  Pt is having 3-4 migraines/week.  Typically last for 1 day and then resolve.  Doesn't take any medication for headaches when she has them.  Previously seeing migraine specialist.  Pt has previously taken sublingual triptan, also was on Imitrex years back.     Review of Systems For ROS see HPI     Objective:   Physical Exam Vitals reviewed.  Constitutional:      General: She is not in acute distress.    Appearance: Normal appearance. She is not ill-appearing.  HENT:     Head: Normocephalic and atraumatic.  Eyes:     Extraocular Movements: Extraocular movements intact.     Conjunctiva/sclera: Conjunctivae normal.     Pupils: Pupils are equal, round, and reactive to light.  Pulmonary:     Effort: Pulmonary effort is normal. No respiratory distress.  Skin:    General: Skin is warm and dry.  Neurological:     General: No focal deficit present.     Mental Status: She is alert and oriented to person, place, and time.     Cranial Nerves: No cranial nerve deficit.     Motor: No weakness.     Gait: Gait normal.  Psychiatric:        Mood and Affect: Mood normal.        Behavior: Behavior normal.        Thought Content: Thought content normal.          Assessment & Plan:

## 2021-06-13 NOTE — Assessment & Plan Note (Signed)
Deteriorated.  Pt reports having 3-4 migraines/week.  Typically they last a few hrs up to a full day.  She does not take any OTC meds for her HAs.  Discussed preventative options- increasing Topamax, stopping Topamax and starting one of the new preventative medications, or starting Propranolol.  She is not interested in an injection and at this time, prefers just to increase Topamax as this can also help w/ weight loss.  Will increase Topamax to 50mg  BID and also add Ubrelvy as a rescue medication.  Pt expressed understanding and is in agreement w/ plan.

## 2021-06-13 NOTE — Patient Instructions (Signed)
Schedule your complete physical in 3 months INCREASE the Topamax to 50mg  twice daily USE the Ubrelvy as needed for migraines Drink LOTS of water to help prevent headaches Call with any questions or concerns Hang in there!

## 2021-06-14 ENCOUNTER — Telehealth: Payer: Self-pay

## 2021-06-14 ENCOUNTER — Telehealth: Payer: Self-pay | Admitting: Family Medicine

## 2021-06-14 NOTE — Telephone Encounter (Signed)
PT husband stating that hydroxycut maybe a risk to his wife. The side of effects of the hydroxycut are the same side of effects she is currently having now.

## 2021-06-14 NOTE — Telephone Encounter (Signed)
PA started for pt Ubrelvy in Cover my meds

## 2021-06-15 NOTE — Telephone Encounter (Signed)
Left pt a vm advising that we did not prescribed Hydroxycut and that is she is still having side effects from it she needs to stop the medication .

## 2021-06-15 NOTE — Telephone Encounter (Signed)
I saw pt for her migraines.  So I'm not sure what side effects he's referring to unless it is headache.  And if they are concerned for possible side effects, they should stop the medication/supplement and see if things improve

## 2021-06-20 ENCOUNTER — Other Ambulatory Visit: Payer: Self-pay

## 2021-06-20 DIAGNOSIS — F411 Generalized anxiety disorder: Secondary | ICD-10-CM

## 2021-06-20 MED ORDER — BUSPIRONE HCL 30 MG PO TABS: 30.0000 mg | ORAL_TABLET | Freq: Two times a day (BID) | ORAL | 3 refills | Status: DC

## 2021-07-15 ENCOUNTER — Other Ambulatory Visit: Payer: Self-pay | Admitting: Family

## 2021-07-15 MED ORDER — TOPIRAMATE 100 MG PO TABS
100.0000 mg | ORAL_TABLET | Freq: Two times a day (BID) | ORAL | 0 refills | Status: DC
Start: 2021-07-15 — End: 2021-09-15

## 2021-07-15 NOTE — Telephone Encounter (Signed)
Advised pt of message . She states she does not want to try any other medication as the topamax works well she feels she needs the dosage increased beyond the 100 mg. I explained to her she will need to see another provider in order to have that done until Dr Beverely Low returns or I can send Dr Beverely Low a message when she returns to see if she will increase the dosage . I explained to the pt that any message we send Dr Beverely Low are routed to other providers at this time . She states she will wait .

## 2021-07-25 ENCOUNTER — Encounter: Payer: Self-pay | Admitting: Family Medicine

## 2021-09-15 ENCOUNTER — Ambulatory Visit (INDEPENDENT_AMBULATORY_CARE_PROVIDER_SITE_OTHER): Payer: 59 | Admitting: Family Medicine

## 2021-09-15 ENCOUNTER — Encounter: Payer: 59 | Admitting: Family Medicine

## 2021-09-15 ENCOUNTER — Encounter: Payer: Self-pay | Admitting: Family Medicine

## 2021-09-15 VITALS — BP 122/70 | HR 100 | Temp 99.0°F | Resp 16 | Ht 61.0 in | Wt 150.0 lb

## 2021-09-15 DIAGNOSIS — E663 Overweight: Secondary | ICD-10-CM | POA: Diagnosis not present

## 2021-09-15 DIAGNOSIS — G43009 Migraine without aura, not intractable, without status migrainosus: Secondary | ICD-10-CM

## 2021-09-15 DIAGNOSIS — D518 Other vitamin B12 deficiency anemias: Secondary | ICD-10-CM | POA: Diagnosis not present

## 2021-09-15 DIAGNOSIS — Z0000 Encounter for general adult medical examination without abnormal findings: Secondary | ICD-10-CM

## 2021-09-15 LAB — CBC WITH DIFFERENTIAL/PLATELET
Basophils Absolute: 0 10*3/uL (ref 0.0–0.1)
Basophils Relative: 0.5 % (ref 0.0–3.0)
Eosinophils Absolute: 0.1 10*3/uL (ref 0.0–0.7)
Eosinophils Relative: 1.1 % (ref 0.0–5.0)
HCT: 39.5 % (ref 36.0–46.0)
Hemoglobin: 13.1 g/dL (ref 12.0–15.0)
Lymphocytes Relative: 25.7 % (ref 12.0–46.0)
Lymphs Abs: 2.6 10*3/uL (ref 0.7–4.0)
MCHC: 33.1 g/dL (ref 30.0–36.0)
MCV: 89.5 fl (ref 78.0–100.0)
Monocytes Absolute: 0.7 10*3/uL (ref 0.1–1.0)
Monocytes Relative: 6.6 % (ref 3.0–12.0)
Neutro Abs: 6.6 10*3/uL (ref 1.4–7.7)
Neutrophils Relative %: 66.1 % (ref 43.0–77.0)
Platelets: 268 10*3/uL (ref 150.0–400.0)
RBC: 4.42 Mil/uL (ref 3.87–5.11)
RDW: 13.7 % (ref 11.5–15.5)
WBC: 10 10*3/uL (ref 4.0–10.5)

## 2021-09-15 LAB — VITAMIN D 25 HYDROXY (VIT D DEFICIENCY, FRACTURES): VITD: 63.17 ng/mL (ref 30.00–100.00)

## 2021-09-15 LAB — HEPATIC FUNCTION PANEL
ALT: 10 U/L (ref 0–35)
AST: 14 U/L (ref 0–37)
Albumin: 4 g/dL (ref 3.5–5.2)
Alkaline Phosphatase: 62 U/L (ref 39–117)
Bilirubin, Direct: 0 mg/dL (ref 0.0–0.3)
Total Bilirubin: 0.2 mg/dL (ref 0.2–1.2)
Total Protein: 7.8 g/dL (ref 6.0–8.3)

## 2021-09-15 LAB — LIPID PANEL
Cholesterol: 206 mg/dL — ABNORMAL HIGH (ref 0–200)
HDL: 63.2 mg/dL (ref >39.00)
LDL Cholesterol: 123 mg/dL — ABNORMAL HIGH (ref 0–99)
NonHDL: 143.29
Total CHOL/HDL Ratio: 3
Triglycerides: 103 mg/dL (ref 0.0–149.0)
VLDL: 20.6 mg/dL (ref 0.0–40.0)

## 2021-09-15 LAB — B12 AND FOLATE PANEL
Folate: >23.9 ng/mL (ref >5.9)
Vitamin B-12: 265 pg/mL (ref 211–911)

## 2021-09-15 LAB — BASIC METABOLIC PANEL
BUN: 10 mg/dL (ref 6–23)
CO2: 23 mEq/L (ref 19–32)
Calcium: 9 mg/dL (ref 8.4–10.5)
Chloride: 107 mEq/L (ref 96–112)
Creatinine, Ser: 0.62 mg/dL (ref 0.40–1.20)
GFR: 115.92 mL/min (ref >60.00)
Glucose, Bld: 72 mg/dL (ref 70–99)
Potassium: 3.4 mEq/L — ABNORMAL LOW (ref 3.5–5.1)
Sodium: 138 mEq/L (ref 135–145)

## 2021-09-15 LAB — TSH: TSH: 2.21 u[IU]/mL (ref 0.35–5.50)

## 2021-09-15 MED ORDER — EPINEPHRINE 0.3 MG/0.3ML IJ SOAJ: 0.3000 mg | Freq: Once | INTRAMUSCULAR | 1 refills | Status: DC | PRN

## 2021-09-15 MED ORDER — TOPIRAMATE 200 MG PO TABS: 200.0000 mg | ORAL_TABLET | Freq: Every day | ORAL | 1 refills | Status: DC

## 2021-09-15 NOTE — Progress Notes (Unsigned)
   Subjective:    Patient ID: Lisa Berg, female    DOB: February 22, 1985, 36 y.o.   MRN: [id number]  HPI CPE- UTD on Tdap, GYN (pap scheduled for Monday)  Patient Care Team    Relationship Specialty Notifications Start End  Sheliah Hatch, MD PCP - General Family Medicine  06/13/12   Harold Hedge, MD Consulting Physician Obstetrics and Gynecology  02/19/15     Health Maintenance  Topic Date Due   INFLUENZA VACCINE  04/09/2022 (Originally 08/09/2021)   TETANUS/TDAP  06/28/2023   HIV Screening  Completed   HPV VACCINES  Aged Out   PAP SMEAR-Modifier  Discontinued   Hepatitis C Screening  Discontinued      Review of Systems Patient reports no vision/ hearing changes, adenopathy,fever, weight change,  persistant/recurrent hoarseness , swallowing issues, chest pain, palpitations, edema, persistant/recurrent cough, hemoptysis, dyspnea (rest/exertional/paroxysmal nocturnal), gastrointestinal bleeding (melena, rectal bleeding), abdominal pain, significant heartburn, bowel changes, GU symptoms (dysuria, hematuria, incontinence), Gyn symptoms (abnormal  bleeding, pain),  syncope, focal weakness, memory loss, numbness & tingling, skin/hair/nail changes, abnormal bruising or bleeding, anxiety, or depression.   Migraines- no improvement w/ increased dose of Topamax.  Now on 100mg  BID. Would like to go back to 200mg  daily as this worked in the past    Objective:   Physical Exam General Appearance:    Alert, cooperative, no distress, appears stated age  Head:    Normocephalic, without obvious abnormality, atraumatic  Eyes:    PERRL, conjunctiva/corneas clear, EOM's intact both eyes  Ears:    Normal TM's and external ear canals, both ears  Nose:   Nares normal, septum midline, mucosa normal, no drainage    or sinus tenderness  Throat:   Lips, mucosa, and tongue normal; teeth and gums normal  Neck:   Supple, symmetrical, trachea midline, no adenopathy;    Thyroid: no  enlargement/tenderness/nodules  Back:     Symmetric, no curvature, ROM normal, no CVA tenderness  Lungs:     Clear to auscultation bilaterally, respirations unlabored  Chest Wall:    No tenderness or deformity   Heart:    Regular rate and rhythm, S1 and S2 normal, no murmur, rub   or gallop  Breast Exam:    Deferred to GYN  Abdomen:     Soft, non-tender, bowel sounds active all four quadrants,    no masses, no organomegaly  Genitalia:    Deferred to GYN  Rectal:    Extremities:   Extremities normal, atraumatic, no cyanosis or edema  Pulses:   2+ and symmetric all extremities  Skin:   Skin color, texture, turgor normal, no rashes or lesions  Lymph nodes:   Cervical, supraclavicular, and axillary nodes normal  Neurologic:   CNII-XII intact, normal strength, sensation and reflexes    throughout          Assessment & Plan:

## 2021-09-15 NOTE — Patient Instructions (Signed)
Follow up in 1 year or as needed We'll notify you of your lab results and make any changes if needed Keep up the good work on healthy diet and regular exercise- you look great!!! Call with any questions or concerns Stay Safe!  Stay Healthy! 

## 2021-09-16 ENCOUNTER — Telehealth: Payer: Self-pay | Admitting: Family Medicine

## 2021-09-16 NOTE — Telephone Encounter (Signed)
Pt called stating that she received  a mychart notification about some test result. Pt want know if some could read her results. The best contact number for patient is [phone].

## 2021-09-16 NOTE — Telephone Encounter (Signed)
Called pt to review questions. She received notification from my chart, but unable to get into mychart account.  MyChart login information given if needed to reset password.  Additionally I reviewed her labs briefly with her, discussed the borderline hypokalemia, potassium rich foods for now unless other recommendations from her primary care provider.  Lipids borderline, similar to previous readings, overall reassuring labs.  All questions were answered.  To Dr. Beverely Low as Lorain Childes.

## 2021-09-19 DIAGNOSIS — N924 Excessive bleeding in the premenopausal period: Secondary | ICD-10-CM | POA: Diagnosis not present

## 2021-09-19 DIAGNOSIS — Z01419 Encounter for gynecological examination (general) (routine) without abnormal findings: Secondary | ICD-10-CM | POA: Diagnosis not present

## 2021-09-19 DIAGNOSIS — Z6829 Body mass index (BMI) 29.0-29.9, adult: Secondary | ICD-10-CM | POA: Diagnosis not present

## 2021-09-20 ENCOUNTER — Other Ambulatory Visit: Payer: Self-pay

## 2021-09-20 NOTE — Telephone Encounter (Signed)
Pt notes her Epi pen is expired and would like a new one sent to the pharmacy the one sent 09/15/21 to [facility] cannot be processed as they do not carry Epi oens at that location any longer    [facility]

## 2021-09-20 NOTE — Assessment & Plan Note (Signed)
Check labs and correct and deficits if present

## 2021-09-20 NOTE — Assessment & Plan Note (Signed)
Ongoing.  Will increase Topamax back to 200mg  daily which is what worked well for her in the past.  If no improvement, will need neuro referral.  Pt expressed understanding and is in agreement w/ plan.

## 2021-09-20 NOTE — Assessment & Plan Note (Signed)
Pt's PE WNL.  UTD on Tdap, pap scheduled for Monday.  Check labs.  Anticipatory guidance provided.

## 2021-09-21 MED ORDER — EPINEPHRINE 0.3 MG/0.3ML IJ SOAJ
0.3000 mg | Freq: Once | INTRAMUSCULAR | 1 refills | Status: DC | PRN
Start: 2021-09-21 — End: 2023-02-20

## 2021-10-21 ENCOUNTER — Other Ambulatory Visit: Payer: Self-pay

## 2021-10-21 ENCOUNTER — Telehealth: Payer: Self-pay | Admitting: Family Medicine

## 2021-10-21 DIAGNOSIS — F411 Generalized anxiety disorder: Secondary | ICD-10-CM

## 2021-10-21 MED ORDER — BUSPIRONE HCL 30 MG PO TABS: 30.0000 mg | ORAL_TABLET | Freq: Two times a day (BID) | ORAL | 3 refills | Status: DC

## 2021-10-21 NOTE — Telephone Encounter (Signed)
Encourage patient to contact the pharmacy for refills or they can request refills through [facility]  (Please schedule appointment if patient has not been seen in over a year)    WHAT PHARMACY WOULD THEY LIKE THIS SENT TO:  [facility] [phone]  MEDICATION NAME & DOSE: buspirone 30 mg  NOTES/COMMENTS FROM PATIENT: if possible, would like a 90 day supply      [place] office please notify patient: It takes 48-72 hours to process rx refill requests Ask patient to call pharmacy to ensure rx is ready before heading there.

## 2021-10-21 NOTE — Telephone Encounter (Signed)
Refill sent to pharmacy left pt a vM

## 2022-02-02 ENCOUNTER — Other Ambulatory Visit: Payer: Self-pay

## 2022-02-02 DIAGNOSIS — F411 Generalized anxiety disorder: Secondary | ICD-10-CM

## 2022-02-02 MED ORDER — BUSPIRONE HCL 30 MG PO TABS: 30.0000 mg | ORAL_TABLET | Freq: Two times a day (BID) | ORAL | 3 refills | Status: DC

## 2022-03-05 ENCOUNTER — Other Ambulatory Visit: Payer: Self-pay | Admitting: Family Medicine

## 2022-06-01 ENCOUNTER — Other Ambulatory Visit: Payer: Self-pay | Admitting: Family Medicine

## 2022-06-01 DIAGNOSIS — F411 Generalized anxiety disorder: Secondary | ICD-10-CM

## 2022-08-30 ENCOUNTER — Other Ambulatory Visit: Payer: Self-pay | Admitting: Family Medicine

## 2022-09-27 ENCOUNTER — Other Ambulatory Visit: Payer: Self-pay | Admitting: Family Medicine

## 2022-09-27 DIAGNOSIS — F411 Generalized anxiety disorder: Secondary | ICD-10-CM

## 2022-09-27 NOTE — Telephone Encounter (Signed)
Last office visit 09/15/2021 Last refill 06/01/2022 F/u in 1 year as needed.

## 2022-10-11 LAB — HM PAP SMEAR

## 2022-11-20 ENCOUNTER — Ambulatory Visit: Payer: 59 | Admitting: Urology

## 2022-11-20 ENCOUNTER — Encounter: Payer: Self-pay | Admitting: Urology

## 2022-11-20 ENCOUNTER — Ambulatory Visit: Payer: Self-pay | Admitting: Urology

## 2022-11-20 ENCOUNTER — Other Ambulatory Visit: Payer: Self-pay | Admitting: Urology

## 2022-11-20 ENCOUNTER — Ambulatory Visit (HOSPITAL_BASED_OUTPATIENT_CLINIC_OR_DEPARTMENT_OTHER)
Admission: RE | Admit: 2022-11-20 | Discharge: 2022-11-20 | Disposition: A | Discharge status: Home / Self Care | Payer: Self-pay | Source: Ambulatory Visit | Attending: Urology | Admitting: Urology

## 2022-11-20 DIAGNOSIS — N201 Calculus of ureter: Secondary | ICD-10-CM

## 2022-11-20 DIAGNOSIS — N200 Calculus of kidney: Secondary | ICD-10-CM | POA: Insufficient documentation

## 2022-11-20 LAB — URINALYSIS, ROUTINE W REFLEX MICROSCOPIC
Bilirubin, UA: NEGATIVE
Glucose, UA: NEGATIVE
Ketones, UA: NEGATIVE
Leukocytes,UA: NEGATIVE
Nitrite, UA: NEGATIVE
Protein,UA: NEGATIVE
Specific Gravity, UA: 1.02 (ref 1.005–1.030)
Urobilinogen, Ur: 0.2 mg/dL (ref 0.2–1.0)
pH, UA: 5.5 (ref 5.0–7.5)

## 2022-11-20 LAB — MICROSCOPIC EXAMINATION

## 2022-11-20 MED ORDER — TAMSULOSIN HCL 0.4 MG PO CAPS: 0.4000 mg | ORAL_CAPSULE | Freq: Every day | ORAL | 0 refills | Status: DC

## 2022-11-20 NOTE — Progress Notes (Signed)
Assessment: 1. Ureteral calculus, left   2. Nephrolithiasis     Plan: I personally reviewed the patient's chart including provider notes, lab and imaging results. I personally reviewed the CT study from 11/14/2022 with results as noted below. I personally reviewed the KUB study from today which shows a 4 x 5 mm calcification in the left pelvis consistent with the known left ureteral calculus. Diagnosis and treatment options for a ureteral calculus including spontaneous stone passage, shockwave lithotripsy, and ureteroscopic stone manipulation were discussed with Krystal Eaton in detail. She has elected to attempt to pass the ureteral calculus. Strain urine, Pain medication as needed.  Rx provided., Anti-emetics as needed.  Rx provided, and Alpha blocker for medical expulsive therapy.  Off label use discussed.  Use and side effects reviewed.  Rx provided. She would like to schedule left ESL next week if she is unable to pass the stone.  Procedure: The patient will be scheduled for left ESL at [facility].  Surgical request is placed with the surgery schedulers and will be scheduled at the patient's/family request. Informed consent is given as documented below. Anesthesia:  local  The patient does not have sleep apnea, history of MRSA, history of VRE, history of cardiac device requiring special anesthetic needs. Patient is stable and considered clear for surgical in an outpatient ambulatory surgery setting as well as patient hospital setting.  Consent for Operation or Procedure: Provider Certification I hereby certify that the nature, purpose, benefits, usual and most frequent risks of, and alternatives to, the operation or procedure have been explained to the patient (or person authorized to sign for the patient) either by me as responsible physician or by the provider who is to perform the operation or procedure. Time spent such that the patient/family has had an opportunity  to ask questions, and that those questions have been answered. The patient or the patient's representative has been advised that selected tasks may be performed by assistants to the primary health care provider(s). I believe that the patient (or person authorized to sign for the patient) understands what has been explained, and has consented to the operation or procedure. No guarantees were implied or made.   Chief Complaint:  Chief Complaint  Patient presents with   Nephrolithiasis    History of Present Illness:  Lisa Berg is a 37 y.o. female who is seen for evaluation of left ureteral calculus. She presented to the emergency room at [facility] in [facility] on 11/14/2022 with left sided abdominal pain.  She had associated nausea and vomiting.  No fevers or chills. CT abdomen and pelvis without contrast showed a 5 mm calculus in the proximal left ureter with mild left-sided hydronephrosis, and numerous to-3 mm nonobstructing renal calculi on the left.  She was treated with pain medication and discharged. Reports that her left flank pain has improved.  She has not been taking pain medication on a regular basis.  She has not been taking the Flomax.  She has not been straining her urine.  She has a history of nephrolithiasis and has undergone a left ureteroscopic stone manipulation in 2020.  Past Medical History:  Past Medical History:  Diagnosis Date   Allergy    Anxiety    Migraine     Past Surgical History:  Past Surgical History:  Procedure Laterality Date   CESAREAN SECTION N/A 01/03/2013   Procedure: CESAREAN SECTION;  Surgeon: Roselle Locus II, MD;  Location: [facility];  Service: Obstetrics;  Laterality: N/A;   WISDOM TOOTH EXTRACTION      Allergies:  Allergies  Allergen Reactions   Clindamycin/Lincomycin Hives   Bee Venom Swelling   Latex Rash   Orange Concentrate [Flavoring Agent] Rash   Penicillins Rash    Family History:  Family History  Problem Relation  Age of Onset   Cancer Mother    Depression Mother    Hyperlipidemia Mother    Hyperlipidemia Father    Heart disease Father    Hypertension Father    Hyperlipidemia Sister    Stroke Maternal Aunt    Miscarriages / Stillbirths Maternal Grandmother    Diabetes Maternal Grandfather    Heart disease Maternal Grandfather    Asthma Neg Hx    Birth defects Neg Hx    COPD Neg Hx    Drug abuse Neg Hx    Hearing loss Neg Hx    Kidney disease Neg Hx    Learning disabilities Neg Hx     Social History:  Social History   Tobacco Use   Smoking status: Never   Smokeless tobacco: Never  Substance Use Topics   Alcohol use: No   Drug use: No    Review of symptoms:  Constitutional:  Negative for unexplained weight loss, night sweats, fever, chills ENT:  Negative for nose bleeds, sinus pain, painful swallowing CV:  Negative for chest pain, shortness of breath, exercise intolerance, palpitations, loss of consciousness Resp:  Negative for cough, wheezing, shortness of breath GI:  Negative for diarrhea, bloody stools GU:  Positives noted in HPI; otherwise negative for gross hematuria, dysuria, urinary incontinence Neuro:  Negative for seizures, poor balance, limb weakness, slurred speech Psych:  Negative for lack of energy, depression, anxiety Endocrine:  Negative for polydipsia, polyuria, symptoms of hypoglycemia (dizziness, hunger, sweating) Hematologic:  Negative for anemia, purpura, petechia, prolonged or excessive bleeding, use of anticoagulants  Allergic:  Negative for difficulty breathing or choking as a result of exposure to anything; no shellfish allergy; no allergic response (rash/itch) to materials, foods  Physical exam: BP 112/73   Pulse 74   Ht 5\' 1"  (1.549 m)   Wt 140 lb (63.5 kg)   LMP 11/06/2022   BMI 26.45 kg/m  GENERAL APPEARANCE:  Well appearing, well developed, well nourished, NAD HEENT: Atraumatic, Normocephalic, oropharynx clear. NECK: Supple without  lymphadenopathy or thyromegaly. LUNGS: Clear to auscultation bilaterally. HEART: Regular Rate and Rhythm without murmurs, gallops, or rubs. ABDOMEN: Soft, non-tender, No Masses. EXTREMITIES: Moves all extremities well.  Without clubbing, cyanosis, or edema. NEUROLOGIC:  Alert and oriented x 3, normal gait, CN II-XII grossly intact.  MENTAL STATUS:  Appropriate. BACK:  Non-tender to palpation.  No CVAT SKIN:  Warm, dry and intact.    Results: U/A:  0-5 WBC, 3-10 RBC

## 2022-11-21 ENCOUNTER — Encounter: Payer: 59 | Admitting: Urology

## 2022-11-22 NOTE — Progress Notes (Signed)
Attempted pre op phone call. Left VM.

## 2022-11-27 ENCOUNTER — Ambulatory Visit (HOSPITAL_BASED_OUTPATIENT_CLINIC_OR_DEPARTMENT_OTHER): Admission: RE | Admit: 2022-11-27 | Payer: Self-pay | Source: Home / Self Care | Admitting: Urology

## 2022-11-27 ENCOUNTER — Encounter (HOSPITAL_BASED_OUTPATIENT_CLINIC_OR_DEPARTMENT_OTHER): Admission: RE | Payer: Self-pay | Source: Home / Self Care

## 2022-11-27 SURGERY — LITHOTRIPSY, ESWL
Anesthesia: LOCAL | Laterality: Left

## 2022-12-05 ENCOUNTER — Encounter: Payer: Self-pay | Admitting: Urology

## 2023-01-18 ENCOUNTER — Other Ambulatory Visit: Payer: Self-pay | Admitting: Family Medicine

## 2023-01-18 DIAGNOSIS — F411 Generalized anxiety disorder: Secondary | ICD-10-CM

## 2023-02-02 ENCOUNTER — Other Ambulatory Visit: Payer: Self-pay | Admitting: Family Medicine

## 2023-02-02 DIAGNOSIS — F411 Generalized anxiety disorder: Secondary | ICD-10-CM

## 2023-02-15 ENCOUNTER — Other Ambulatory Visit: Payer: Self-pay | Admitting: Family Medicine

## 2023-02-15 DIAGNOSIS — F411 Generalized anxiety disorder: Secondary | ICD-10-CM

## 2023-02-16 ENCOUNTER — Other Ambulatory Visit: Payer: Self-pay | Admitting: Family Medicine

## 2023-02-16 DIAGNOSIS — F411 Generalized anxiety disorder: Secondary | ICD-10-CM

## 2023-02-20 ENCOUNTER — Encounter: Payer: Self-pay | Admitting: Family Medicine

## 2023-02-20 ENCOUNTER — Ambulatory Visit: Payer: Self-pay | Admitting: Family Medicine

## 2023-02-20 VITALS — BP 110/64 | HR 71 | Temp 98.0°F | Ht 61.0 in

## 2023-02-20 DIAGNOSIS — G43009 Migraine without aura, not intractable, without status migrainosus: Secondary | ICD-10-CM

## 2023-02-20 DIAGNOSIS — F411 Generalized anxiety disorder: Secondary | ICD-10-CM | POA: Diagnosis not present

## 2023-02-20 DIAGNOSIS — L600 Ingrowing nail: Secondary | ICD-10-CM

## 2023-02-20 MED ORDER — BUSPIRONE HCL 30 MG PO TABS: 30.0000 mg | ORAL_TABLET | Freq: Two times a day (BID) | ORAL | 1 refills | Status: DC

## 2023-02-20 MED ORDER — TOPIRAMATE 200 MG PO TABS: 200.0000 mg | ORAL_TABLET | Freq: Every day | ORAL | 1 refills | Status: DC

## 2023-02-20 MED ORDER — DOXYCYCLINE HYCLATE 100 MG PO TABS: 100.0000 mg | ORAL_TABLET | Freq: Two times a day (BID) | ORAL | 0 refills | Status: AC

## 2023-02-20 MED ORDER — EPINEPHRINE 0.3 MG/0.3ML IJ SOAJ: 0.3000 mg | Freq: Once | INTRAMUSCULAR | 1 refills | Status: AC | PRN

## 2023-02-20 NOTE — Progress Notes (Signed)
   Subjective:    Patient ID: Lisa Berg, female    DOB: [birth year], 38 y.o.   MRN: [id number]  HPI Anxiety- chronic problem.  Currently on Buspar 30mg  twice daily.  Feels anxiety is well controlled.  Continues to have issues w/ parents and sisters.    Migraine- chronic problem.  Currently on Topamax 200mg  daily as prophylaxis.  Reports this 'for the most part' does a good job at preventing headaches.    Toe pain- R great toe.  Sxs started in December.  Can be painful to wear shoes.  + redness, swelling.  + drainage and oozing.  It will wax and wane but not resolve.  Has hx of ingrown nails but has never lasted this long.   Review of Systems For ROS see HPI     Objective:   Physical Exam Vitals reviewed.  Constitutional:      General: She is not in acute distress.    Appearance: Normal appearance. She is not ill-appearing.  HENT:     Head: Normocephalic and atraumatic.  Cardiovascular:     Rate and Rhythm: Normal rate.     Pulses: Normal pulses.  Pulmonary:     Effort: Pulmonary effort is normal.  Skin:    General: Skin is warm and dry.     Findings: Erythema (surrounding medial edge of R great toenail, TTP, w/ induration) present.  Neurological:     General: No focal deficit present.     Mental Status: She is alert and oriented to person, place, and time.  Psychiatric:        Mood and Affect: Mood normal.        Behavior: Behavior normal.        Thought Content: Thought content normal.           Assessment & Plan:  Ingrown toenail- new.  R foot.  + infxn.  Start Doxycycline due to allergies.  If no improvement, will need podiatry referral.  Pt declined today.

## 2023-02-20 NOTE — Assessment & Plan Note (Signed)
Chronic problem.  Pt reports doing well on Topamax daily.  Refill provided.  Will follow.

## 2023-02-20 NOTE — Patient Instructions (Signed)
Schedule your complete physical in 6 months START the Doxycycline twice daily for the infected nail- take w/ food IF no improvement after the antibiotic, let me know and we can refer to Podiatry Keep up the good work!  You look great!! Call with any questions or concerns Stay Safe!  Stay Healthy! Hang in there!!

## 2023-02-20 NOTE — Assessment & Plan Note (Signed)
Chronic problem.  On Buspar 30mg  BID w/ adequate control.  Having difficult family issues w/ parents/sisters but overall feels that things are going well.  No med changes at this time.  Will follow.

## 2023-08-12 ENCOUNTER — Other Ambulatory Visit: Payer: Self-pay | Admitting: Family Medicine

## 2023-08-15 ENCOUNTER — Other Ambulatory Visit: Payer: Self-pay | Admitting: Family Medicine

## 2023-08-15 DIAGNOSIS — F411 Generalized anxiety disorder: Secondary | ICD-10-CM

## 2023-08-27 ENCOUNTER — Encounter: Payer: No Typology Code available for payment source | Admitting: Family Medicine

## 2024-02-07 ENCOUNTER — Other Ambulatory Visit: Payer: Self-pay | Admitting: Family Medicine

## 2024-02-07 NOTE — Telephone Encounter (Signed)
 Pt is requesting a refill last ov 02/20/2023 Is this okay to refill

## 2024-02-14 ENCOUNTER — Other Ambulatory Visit: Payer: Self-pay | Admitting: Family Medicine

## 2024-02-14 DIAGNOSIS — F411 Generalized anxiety disorder: Secondary | ICD-10-CM
